# Patient Record
Sex: Female | Born: 1960 | Race: Black or African American | Hispanic: No | Marital: Single | Smoking: Former smoker
Health system: Southern US, Community
[De-identification: ages and names within clinical notes are randomized; demographics above are authoritative.]

## PROBLEM LIST (undated history)

## (undated) DIAGNOSIS — J302 Other seasonal allergic rhinitis: Secondary | ICD-10-CM

## (undated) DIAGNOSIS — F32A Depression, unspecified: Secondary | ICD-10-CM

## (undated) DIAGNOSIS — E785 Hyperlipidemia, unspecified: Secondary | ICD-10-CM

## (undated) DIAGNOSIS — Z9289 Personal history of other medical treatment: Secondary | ICD-10-CM

## (undated) DIAGNOSIS — F419 Anxiety disorder, unspecified: Secondary | ICD-10-CM

## (undated) DIAGNOSIS — D649 Anemia, unspecified: Secondary | ICD-10-CM

## (undated) DIAGNOSIS — R011 Cardiac murmur, unspecified: Secondary | ICD-10-CM

## (undated) DIAGNOSIS — F329 Major depressive disorder, single episode, unspecified: Secondary | ICD-10-CM

## (undated) DIAGNOSIS — M1990 Unspecified osteoarthritis, unspecified site: Secondary | ICD-10-CM

## (undated) DIAGNOSIS — I10 Essential (primary) hypertension: Secondary | ICD-10-CM

## (undated) HISTORY — DX: Depression, unspecified: F32.A

## (undated) HISTORY — DX: Unspecified osteoarthritis, unspecified site: M19.90

## (undated) HISTORY — PX: POLYPECTOMY: SHX149

## (undated) HISTORY — DX: Hyperlipidemia, unspecified: E78.5

## (undated) HISTORY — DX: Anxiety disorder, unspecified: F41.9

## (undated) HISTORY — PX: TUBAL LIGATION: SHX77

## (undated) HISTORY — DX: Major depressive disorder, single episode, unspecified: F32.9

## (undated) HISTORY — DX: Other seasonal allergic rhinitis: J30.2

## (undated) HISTORY — DX: Anemia, unspecified: D64.9

## (undated) HISTORY — PX: COLONOSCOPY: SHX174

## (undated) HISTORY — DX: Essential (primary) hypertension: I10

## (undated) HISTORY — DX: Personal history of other medical treatment: Z92.89

## (undated) HISTORY — PX: ESOPHAGOGASTRODUODENOSCOPY ENDOSCOPY: SHX5814

---

## 1998-01-09 ENCOUNTER — Emergency Department (HOSPITAL_COMMUNITY): Admission: EM | Admit: 1998-01-09 | Discharge: 1998-01-09 | Payer: Self-pay | Admitting: Emergency Medicine

## 1998-01-17 ENCOUNTER — Emergency Department (HOSPITAL_COMMUNITY): Admission: EM | Admit: 1998-01-17 | Discharge: 1998-01-17 | Payer: Self-pay | Admitting: Emergency Medicine

## 1998-07-13 ENCOUNTER — Emergency Department (HOSPITAL_COMMUNITY): Admission: EM | Admit: 1998-07-13 | Discharge: 1998-07-13 | Payer: Self-pay | Admitting: Emergency Medicine

## 2000-12-06 ENCOUNTER — Encounter: Payer: Self-pay | Admitting: Emergency Medicine

## 2000-12-06 ENCOUNTER — Emergency Department (HOSPITAL_COMMUNITY): Admission: EM | Admit: 2000-12-06 | Discharge: 2000-12-06 | Payer: Self-pay

## 2000-12-09 ENCOUNTER — Emergency Department (HOSPITAL_COMMUNITY): Admission: EM | Admit: 2000-12-09 | Discharge: 2000-12-09 | Payer: Self-pay | Admitting: Emergency Medicine

## 2001-05-11 ENCOUNTER — Emergency Department (HOSPITAL_COMMUNITY): Admission: EM | Admit: 2001-05-11 | Discharge: 2001-05-11 | Payer: Self-pay | Admitting: Emergency Medicine

## 2001-05-11 ENCOUNTER — Encounter: Payer: Self-pay | Admitting: Emergency Medicine

## 2003-04-19 ENCOUNTER — Encounter: Admission: RE | Admit: 2003-04-19 | Discharge: 2003-04-19 | Payer: Self-pay | Admitting: Internal Medicine

## 2003-07-29 ENCOUNTER — Emergency Department (HOSPITAL_COMMUNITY): Admission: EM | Admit: 2003-07-29 | Discharge: 2003-07-29 | Payer: Self-pay | Admitting: Family Medicine

## 2003-07-30 ENCOUNTER — Encounter: Admission: RE | Admit: 2003-07-30 | Discharge: 2003-07-30 | Payer: Self-pay | Admitting: Gastroenterology

## 2003-08-26 ENCOUNTER — Emergency Department (HOSPITAL_COMMUNITY): Admission: AD | Admit: 2003-08-26 | Discharge: 2003-08-26 | Payer: Self-pay | Admitting: Family Medicine

## 2005-12-21 ENCOUNTER — Ambulatory Visit (HOSPITAL_COMMUNITY): Admission: RE | Admit: 2005-12-21 | Discharge: 2005-12-21 | Payer: Self-pay | Admitting: Obstetrics

## 2006-02-11 ENCOUNTER — Encounter: Admission: RE | Admit: 2006-02-11 | Discharge: 2006-02-11 | Payer: Self-pay | Admitting: Obstetrics

## 2007-07-25 ENCOUNTER — Emergency Department (HOSPITAL_COMMUNITY): Admission: EM | Admit: 2007-07-25 | Discharge: 2007-07-26 | Payer: Self-pay | Admitting: Emergency Medicine

## 2010-06-27 ENCOUNTER — Encounter: Payer: Self-pay | Admitting: Gastroenterology

## 2010-06-28 ENCOUNTER — Encounter: Payer: Self-pay | Admitting: Obstetrics

## 2011-02-26 LAB — CROSSMATCH: ABO/RH(D): O POS

## 2011-02-26 LAB — BASIC METABOLIC PANEL
Chloride: 102
Glucose, Bld: 97

## 2011-02-26 LAB — CBC
MCHC: 28.6 — ABNORMAL LOW
MCV: 54.3 — ABNORMAL LOW
Platelets: 260
RBC: 3.47 — ABNORMAL LOW

## 2011-02-26 LAB — IRON AND TIBC: Iron: 13 — ABNORMAL LOW

## 2011-02-26 LAB — RETICULOCYTES
Retic Count, Absolute: 69.8
Retic Ct Pct: 2

## 2011-02-26 LAB — FERRITIN: Ferritin: <1 — ABNORMAL LOW (ref 10–291)

## 2011-02-26 LAB — DIFFERENTIAL
Basophils Relative: 6 — ABNORMAL HIGH
Eosinophils Relative: 1
Monocytes Relative: 9

## 2011-02-26 LAB — APTT: aPTT: 29

## 2011-02-26 LAB — PROTIME-INR
INR: 1
Prothrombin Time: 13.1

## 2011-02-26 LAB — ABO/RH: ABO/RH(D): O POS

## 2012-07-25 ENCOUNTER — Ambulatory Visit: Payer: Self-pay | Admitting: Family Medicine

## 2012-11-08 ENCOUNTER — Encounter: Payer: Self-pay | Admitting: Internal Medicine

## 2012-12-11 ENCOUNTER — Ambulatory Visit (AMBULATORY_SURGERY_CENTER): Payer: BC Managed Care – PPO | Admitting: *Deleted

## 2012-12-11 VITALS — Ht 69.0 in | Wt 224.6 lb

## 2012-12-11 DIAGNOSIS — Z1211 Encounter for screening for malignant neoplasm of colon: Secondary | ICD-10-CM

## 2012-12-11 MED ORDER — MOVIPREP 100 G PO SOLR: 1.0000 | Freq: Once | ORAL | Status: DC

## 2012-12-11 NOTE — Progress Notes (Signed)
Denies allergies to eggs or soy products. Denies complications with anesthesia or sedation. 

## 2012-12-12 ENCOUNTER — Encounter: Payer: Self-pay | Admitting: Internal Medicine

## 2012-12-22 ENCOUNTER — Ambulatory Visit (AMBULATORY_SURGERY_CENTER): Payer: BC Managed Care – PPO | Admitting: Internal Medicine

## 2012-12-22 ENCOUNTER — Encounter: Payer: Self-pay | Admitting: Internal Medicine

## 2012-12-22 VITALS — BP 159/78 | HR 67 | Temp 98.3°F | Resp 17 | Ht 69.0 in | Wt 224.0 lb

## 2012-12-22 DIAGNOSIS — D126 Benign neoplasm of colon, unspecified: Secondary | ICD-10-CM

## 2012-12-22 DIAGNOSIS — Z800 Family history of malignant neoplasm of digestive organs: Secondary | ICD-10-CM

## 2012-12-22 DIAGNOSIS — Z1211 Encounter for screening for malignant neoplasm of colon: Secondary | ICD-10-CM

## 2012-12-22 MED ORDER — SODIUM CHLORIDE 0.9 % IV SOLN: 500.0000 mL | INTRAVENOUS | Status: DC

## 2012-12-22 NOTE — Progress Notes (Signed)
Called to room to assist during endoscopic procedure.  Patient ID and intended procedure confirmed with present staff. Received instructions for my participation in the procedure from the performing physician.  

## 2012-12-22 NOTE — Progress Notes (Signed)
Patient did not experience any of the following events: a burn prior to discharge; a fall within the facility; wrong site/side/patient/procedure/implant event; or a hospital transfer or hospital admission upon discharge from the facility. (G8907) Patient did not have preoperative order for IV antibiotic SSI prophylaxis. (G8918)  

## 2012-12-22 NOTE — Op Note (Signed)
[facility] [address] [place], [address]   COLONOSCOPY PROCEDURE REPORT  PATIENT: Jessica Huber, Jessica Huber  MR#: [id number] BIRTHDATE: Dec 05, 1960 , 51  yrs. old GENDER: Female ENDOSCOPIST: Beverley Fiedler, MD REFERRED XB:JYNWG Cloward, M.D. PROCEDURE DATE:  12/22/2012 PROCEDURE:   Colonoscopy with snare polypectomy ASA CLASS:   Class II INDICATIONS:first colonoscopy, elevated risk screening, and Patient's immediate family history of colon cancer. MEDICATIONS: MAC sedation, administered by CRNA and propofol (Diprivan) 250mg  IV  DESCRIPTION OF PROCEDURE:   After the risks benefits and alternatives of the procedure were thoroughly explained, informed consent was obtained.  A digital rectal exam revealed no rectal mass.   The LB [id number]  endoscope was introduced through the anus and advanced to the cecum, which was identified by both the appendix and ileocecal valve. No adverse events experienced. The quality of the prep was good, using MoviPrep  The instrument was then slowly withdrawn as the colon was fully examined.   COLON FINDINGS: A sessile polyp measuring 6 mm in size was found in the rectosigmoid colon.  A polypectomy was performed with a cold snare.  The resection was complete and the polyp tissue was completely retrieved.   The colon mucosa was otherwise normal. Retroflexed views revealed no abnormalities other than hypertrophied anal papillae. The time to cecum=3 minutes 39 seconds.  Withdrawal time=10 minutes 11 seconds.  The scope was withdrawn and the procedure completed.  COMPLICATIONS: There were no complications.  ENDOSCOPIC IMPRESSION: 1.   Sessile polyp measuring 6 mm in size was found in the rectosigmoid colon; polypectomy was performed with a cold snare 2.   The colon mucosa was otherwise normal  RECOMMENDATIONS: 1.  Await pathology results 2.  Repeat Colonoscopy in 5 years. 3.  You will receive a letter within 1-2 weeks with the  results of your biopsy as well as final recommendations.  Please call my office if you have not received a letter after 3 weeks.   eSigned:  Beverley Fiedler, MD 12/22/2012 3:16 PM   cc: Gabriel Earing, MD and The Patient

## 2012-12-22 NOTE — Patient Instructions (Addendum)
Discharge instructions given with verbal understanding. Handout on polyps given. Resume previous medications. YOU HAD AN ENDOSCOPIC PROCEDURE TODAY AT THE [facility]: Refer to the procedure report that was given to you for any specific questions about what was found during the examination.  If the procedure report does not answer your questions, please call your gastroenterologist to clarify.  If you requested that your care partner not be given the details of your procedure findings, then the procedure report has been included in a sealed envelope for you to review at your convenience later.  YOU SHOULD EXPECT: Some feelings of bloating in the abdomen. Passage of more gas than usual.  Walking can help get rid of the air that was put into your GI tract during the procedure and reduce the bloating. If you had a lower endoscopy (such as a colonoscopy or flexible sigmoidoscopy) you may notice spotting of blood in your stool or on the toilet paper. If you underwent a bowel prep for your procedure, then you may not have a normal bowel movement for a few days.  DIET: Your first meal following the procedure should be a light meal and then it is ok to progress to your normal diet.  A half-sandwich or bowl of soup is an example of a good first meal.  Heavy or fried foods are harder to digest and may make you feel nauseous or bloated.  Likewise meals heavy in dairy and vegetables can cause extra gas to form and this can also increase the bloating.  Drink plenty of fluids but you should avoid alcoholic beverages for 24 hours.  ACTIVITY: Your care partner should take you home directly after the procedure.  You should plan to take it easy, moving slowly for the rest of the day.  You can resume normal activity the day after the procedure however you should NOT DRIVE or use heavy machinery for 24 hours (because of the sedation medicines used during the test).    SYMPTOMS TO REPORT IMMEDIATELY: A  gastroenterologist can be reached at any hour.  During normal business hours, 8:30 AM to 5:00 PM Monday through Friday, call [phone].  After hours and on weekends, please call the GI answering service at [phone] who will take a message and have the physician on call contact you.   Following lower endoscopy (colonoscopy or flexible sigmoidoscopy):  Excessive amounts of blood in the stool  Significant tenderness or worsening of abdominal pains  Swelling of the abdomen that is new, acute  Fever of 100F or higher  FOLLOW UP: If any biopsies were taken you will be contacted by phone or by letter within the next 1-3 weeks.  Call your gastroenterologist if you have not heard about the biopsies in 3 weeks.  Our staff will call the home number listed on your records the next business day following your procedure to check on you and address any questions or concerns that you may have at that time regarding the information given to you following your procedure. This is a courtesy call and so if there is no answer at the home number and we have not heard from you through the emergency physician on call, we will assume that you have returned to your regular daily activities without incident.  SIGNATURES/CONFIDENTIALITY: You and/or your care partner have signed paperwork which will be entered into your electronic medical record.  These signatures attest to the fact that that the information above on your After Visit Summary has   been reviewed and is understood.  Full responsibility of the confidentiality of this discharge information lies with you and/or your care-partner. 

## 2012-12-25 ENCOUNTER — Telehealth: Payer: Self-pay

## 2012-12-25 NOTE — Telephone Encounter (Signed)
Left message on answering machine. 

## 2012-12-27 ENCOUNTER — Encounter: Payer: Self-pay | Admitting: Internal Medicine

## 2012-12-27 DIAGNOSIS — D126 Benign neoplasm of colon, unspecified: Secondary | ICD-10-CM | POA: Insufficient documentation

## 2013-02-06 ENCOUNTER — Encounter: Payer: Self-pay | Admitting: Obstetrics & Gynecology

## 2013-02-07 ENCOUNTER — Encounter: Payer: Self-pay | Admitting: Obstetrics

## 2013-02-22 DIAGNOSIS — D509 Iron deficiency anemia, unspecified: Secondary | ICD-10-CM | POA: Insufficient documentation

## 2013-02-22 DIAGNOSIS — G723 Periodic paralysis: Secondary | ICD-10-CM | POA: Insufficient documentation

## 2013-02-22 DIAGNOSIS — R5381 Other malaise: Secondary | ICD-10-CM | POA: Insufficient documentation

## 2013-02-22 DIAGNOSIS — R5383 Other fatigue: Secondary | ICD-10-CM | POA: Insufficient documentation

## 2013-02-23 ENCOUNTER — Encounter: Payer: Self-pay | Admitting: Obstetrics

## 2013-02-26 ENCOUNTER — Ambulatory Visit: Payer: Self-pay | Admitting: Obstetrics & Gynecology

## 2013-02-28 ENCOUNTER — Ambulatory Visit: Payer: Self-pay | Admitting: Obstetrics & Gynecology

## 2013-03-19 ENCOUNTER — Other Ambulatory Visit: Payer: Self-pay

## 2013-03-19 ENCOUNTER — Encounter: Payer: Self-pay | Admitting: Obstetrics & Gynecology

## 2013-03-19 ENCOUNTER — Ambulatory Visit (INDEPENDENT_AMBULATORY_CARE_PROVIDER_SITE_OTHER): Payer: BC Managed Care – PPO | Admitting: Obstetrics & Gynecology

## 2013-03-19 ENCOUNTER — Ambulatory Visit: Payer: Self-pay | Admitting: Obstetrics & Gynecology

## 2013-03-19 VITALS — BP 135/88 | HR 77 | Temp 97.3°F | Ht 69.0 in | Wt 221.0 lb

## 2013-03-19 DIAGNOSIS — N760 Acute vaginitis: Secondary | ICD-10-CM

## 2013-03-19 DIAGNOSIS — N939 Abnormal uterine and vaginal bleeding, unspecified: Secondary | ICD-10-CM | POA: Insufficient documentation

## 2013-03-19 DIAGNOSIS — N926 Irregular menstruation, unspecified: Secondary | ICD-10-CM

## 2013-03-19 MED ORDER — NORETHINDRONE ACETATE 5 MG PO TABS: 5.0000 mg | ORAL_TABLET | Freq: Two times a day (BID) | ORAL | Status: DC

## 2013-03-19 NOTE — Progress Notes (Signed)
Endometrial Biopsy Procedure Note  Pre-operative Diagnosis: Abnormal uterine bleeding  Post-operative Diagnosis: same  Indications: abnormal uterine bleeding  Procedure Details     The risks (including infection, bleeding, pain, and uterine perforation) and benefits of the procedure were explained to the patient and Written informed consent was obtained.     The patient was placed in the dorsal lithotomy position.  Bimanual exam showed the uterus to be in the neutral position.  A Graves' speculum inserted in the vagina, and the cervix prepped with povidone iodine.    A sharp tenaculum was applied to the anterior lip of the cervix for stabilization.  A sterile uterine sound was used to sound the uterus to a depth of 7cm.  A Pipelle endometrial aspirator was used to sample the endometrium.  Sample was sent for pathologic examination.  Condition: Stable  Complications: None  Plan:  The patient was advised to call for any fever or for prolonged or severe pain or bleeding. She was advised to use OTC analgesics as needed for mild to moderate pain. She was advised to avoid vaginal intercourse for 48 hours or until the bleeding has completely stopped.

## 2013-03-19 NOTE — Patient Instructions (Signed)

## 2013-03-19 NOTE — Progress Notes (Signed)
Subjective:     Jessica Huber is a 52 y.o. female here for a routine exam.  Current complaints: problem visit. Pt states she has been having extremely heavy periods for the last 5 years. Pt states she has been going for IV iron treatments for the last 2 months. Pt states she has even needed a blood transfusion. Pt states her cycles last 2 weeks and is extremely heavy the entire time and patient states she passes clots.    Personal health questionnaire reviewed: yes.   Gynecologic History Patient's last menstrual period was 02/18/2013. Contraception: abstinence Last Pap: 02/2013. Results were: normal Last mammogram: approx. 15 years ago. Results were: normal  Obstetric History OB History  No data available     The following portions of the patient's history were reviewed and updated as appropriate: allergies, current medications, past family history, past medical history, past social history, past surgical history and problem list.  Review of Systems Pertinent items are noted in HPI.    Objective:      General appearance: alert Abdomen: soft, non-tender; bowel sounds normal; no masses,  no organomegaly Pelvic: cervix normal in appearance, external genitalia normal, no adnexal masses or tenderness, uterus normal size, shape, and consistency and vagina normal without discharge       Assessment:   AUB--?O  Plan:   Return for sonohysterogram Treatment options reviewed/education materials provided Aygestin 10 mg/day for now

## 2013-03-20 ENCOUNTER — Encounter: Payer: Self-pay | Admitting: Obstetrics & Gynecology

## 2013-03-20 LAB — WET PREP BY MOLECULAR PROBE: Gardnerella vaginalis: NEGATIVE

## 2013-03-23 ENCOUNTER — Telehealth: Payer: Self-pay | Admitting: *Deleted

## 2013-03-23 ENCOUNTER — Encounter: Payer: Self-pay | Admitting: Obstetrics & Gynecology

## 2013-03-23 DIAGNOSIS — A5901 Trichomonal vulvovaginitis: Secondary | ICD-10-CM | POA: Insufficient documentation

## 2013-03-23 MED ORDER — TINIDAZOLE 500 MG PO TABS: 2.0000 g | ORAL_TABLET | Freq: Once | ORAL | Status: DC

## 2013-03-23 NOTE — Telephone Encounter (Signed)
Patient informed per Dr Tamela Oddi instructions. Rx sent to pharmacy.

## 2013-03-23 NOTE — Telephone Encounter (Signed)
Message copied by Glendell Docker on Fri Mar 23, 2013  5:56 PM ------      Message from: Antionette Char      Created: Fri Mar 23, 2013  8:05 AM       Call pt w/result and treat. ------

## 2013-04-03 ENCOUNTER — Other Ambulatory Visit: Payer: Self-pay | Admitting: *Deleted

## 2013-04-03 DIAGNOSIS — D259 Leiomyoma of uterus, unspecified: Secondary | ICD-10-CM

## 2013-04-04 ENCOUNTER — Other Ambulatory Visit: Payer: BC Managed Care – PPO

## 2013-04-04 ENCOUNTER — Ambulatory Visit: Payer: BC Managed Care – PPO | Admitting: Obstetrics & Gynecology

## 2013-04-11 ENCOUNTER — Ambulatory Visit (INDEPENDENT_AMBULATORY_CARE_PROVIDER_SITE_OTHER): Payer: BC Managed Care – PPO

## 2013-04-11 ENCOUNTER — Encounter: Payer: Self-pay | Admitting: Obstetrics & Gynecology

## 2013-04-11 ENCOUNTER — Ambulatory Visit (INDEPENDENT_AMBULATORY_CARE_PROVIDER_SITE_OTHER): Payer: BC Managed Care – PPO | Admitting: Obstetrics & Gynecology

## 2013-04-11 VITALS — BP 162/95 | HR 71 | Temp 98.2°F | Ht 69.0 in | Wt 221.0 lb

## 2013-04-11 DIAGNOSIS — N939 Abnormal uterine and vaginal bleeding, unspecified: Secondary | ICD-10-CM

## 2013-04-11 DIAGNOSIS — D259 Leiomyoma of uterus, unspecified: Secondary | ICD-10-CM

## 2013-04-11 DIAGNOSIS — N926 Irregular menstruation, unspecified: Secondary | ICD-10-CM

## 2013-04-11 MED ORDER — LEUPROLIDE ACETATE 3.75 MG IM KIT
3.7500 mg | PACK | Freq: Once | INTRAMUSCULAR | Status: AC
  Administered 2013-04-11: 3.75 mg via INTRAMUSCULAR

## 2013-04-12 ENCOUNTER — Encounter: Payer: Self-pay | Admitting: Obstetrics & Gynecology

## 2013-04-12 DIAGNOSIS — F411 Generalized anxiety disorder: Secondary | ICD-10-CM | POA: Insufficient documentation

## 2013-04-12 NOTE — Progress Notes (Signed)
SONOHYSTEROGRAM PROCEDURE NOTE  SONOHYSTEROGRAM PROCEDURE:   A time-out was performed confirming patient name, allergy status and procedure.  A UPT was negative.  A Graves speculum was placed in the vagina.  The cervix was prepped with betadine.  The cervix was grasped with a single tooth tenaculum.  The IUI catherter was primed with sterile water.  The IUI was introduced into the uterine cavity.  The single tooth tenaculum and speculum were removed.  The vaginal probe was placed.  The sterile water was instilled in the uterine cavity.   The IUI was removed.  The single tooth tenaculum was removed with minimal bleeding noted from the cervix.  The patient tolerated the procedure well.  FINDINGS:  The endometrial cavity was irregular/distorted by submucous involvement of the fibroid tumors.

## 2013-04-12 NOTE — Patient Instructions (Signed)
Hysterectomy Information  A hysterectomy is a procedure where your uterus is surgically removed. It will no longer be possible to have menstrual periods or to become pregnant. The tubes and ovaries can be removed (bilateral salpingo-oopherectomy) during this surgery as well.  REASONS FOR A HYSTERECTOMY  Persistent, abnormal bleeding.  Lasting (chronic) pelvic pain or infection.  The lining of the uterus (endometrium) starts growing outside the uterus (endometriosis).  The endometrium starts growing in the muscle of the uterus (adenomyosis).  The uterus falls down into the vagina (pelvic organ prolapse).  Symptomatic uterine fibroids.  Precancerous cells.  Cervical cancer or uterine cancer. TYPES OF HYSTERECTOMIES  Supracervical hysterectomy. This type removes the top part of the uterus, but not the cervix.  Total hysterectomy. This type removes the uterus and cervix.  Radical hysterectomy. This type removes the uterus, cervix, and the fibrous tissue that holds the uterus in place in the pelvis (parametrium). WAYS A HYSTERECTOMY CAN BE PERFORMED  Abdominal hysterectomy. A large surgical cut (incision) is made in the abdomen. The uterus is removed through this incision.  Vaginal hysterectomy. An incision is made in the vagina. The uterus is removed through this incision. There are no abdominal incisions.  Conventional laparoscopic hysterectomy. A thin, lighted tube with a camera (laparoscope) is inserted into 3 or 4 small incisions in the abdomen. The uterus is cut into small pieces. The small pieces are removed through the incisions, or they are removed through the vagina.  Laparoscopic assisted vaginal hysterectomy (LAVH). Three or four small incisions are made in the abdomen. Part of the surgery is performed laparoscopically and part vaginally. The uterus is removed through the vagina.  Robot-assisted laparoscopic hysterectomy. A laparoscope is inserted into 3 or 4 small  incisions in the abdomen. A computer-controlled device is used to give the surgeon a 3D image. This allows for more precise movements of surgical instruments. The uterus is cut into small pieces and removed through the incisions or removed through the vagina. RISKS OF HYSTERECTOMY   Bleeding and risk of blood transfusion. Tell your caregiver if you do not want to receive any blood products.  Blood clots in the legs or lung.  Infection.  Injury to surrounding organs.  Anesthesia problems or side effects.  Conversion to an abdominal hysterectomy. WHAT TO EXPECT AFTER A HYSTERECTOMY  You will be given pain medicine.  You will need to have someone with you for the first 3 to 5 days after you go home.  You will need to follow up with your surgeon in 2 to 4 weeks after surgery to evaluate your progress.  You may have early menopause symptoms like hot flashes, night sweats, and insomnia.  If you had a hysterectomy for a problem that was not a cancer or a condition that could lead to cancer, then you no longer need Pap tests. However, even if you no longer need a Pap test, a regular exam is a good idea to make sure no other problems are starting. Document Released: 11/17/2000 Document Revised: 08/16/2011 Document Reviewed: 01/02/2011 Exit[employer] Patient Information 2014 [employer].  

## 2013-04-15 ENCOUNTER — Other Ambulatory Visit: Payer: Self-pay | Admitting: *Deleted

## 2013-04-16 ENCOUNTER — Encounter: Payer: Self-pay | Admitting: Obstetrics & Gynecology

## 2013-04-20 ENCOUNTER — Encounter: Payer: Self-pay | Admitting: Obstetrics & Gynecology

## 2013-04-26 ENCOUNTER — Ambulatory Visit: Payer: BC Managed Care – PPO | Admitting: Obstetrics & Gynecology

## 2013-05-01 ENCOUNTER — Other Ambulatory Visit: Payer: Self-pay | Admitting: *Deleted

## 2013-05-01 DIAGNOSIS — A599 Trichomoniasis, unspecified: Secondary | ICD-10-CM

## 2013-05-01 MED ORDER — TINIDAZOLE 500 MG PO TABS: 2.0000 g | ORAL_TABLET | Freq: Once | ORAL | Status: DC

## 2013-05-02 ENCOUNTER — Ambulatory Visit: Payer: BC Managed Care – PPO | Admitting: Obstetrics & Gynecology

## 2013-05-04 ENCOUNTER — Encounter (HOSPITAL_COMMUNITY): Payer: Self-pay | Admitting: Pharmacy Technician

## 2013-05-08 ENCOUNTER — Encounter (HOSPITAL_COMMUNITY)
Admission: RE | Admit: 2013-05-08 | Discharge: 2013-05-08 | Disposition: A | Discharge status: Home / Self Care | Payer: BC Managed Care – PPO | Source: Ambulatory Visit | Attending: Obstetrics & Gynecology | Admitting: Obstetrics & Gynecology

## 2013-05-08 ENCOUNTER — Encounter (HOSPITAL_COMMUNITY): Payer: Self-pay

## 2013-05-08 DIAGNOSIS — D259 Leiomyoma of uterus, unspecified: Secondary | ICD-10-CM | POA: Insufficient documentation

## 2013-05-08 DIAGNOSIS — Z01812 Encounter for preprocedural laboratory examination: Secondary | ICD-10-CM | POA: Insufficient documentation

## 2013-05-08 HISTORY — DX: Cardiac murmur, unspecified: R01.1

## 2013-05-08 LAB — HCG, SERUM, QUALITATIVE: Preg, Serum: NEGATIVE

## 2013-05-08 LAB — CBC
Hemoglobin: 10.7 g/dL — ABNORMAL LOW (ref 12.0–15.0)
MCH: 26.9 pg (ref 26.0–34.0)
MCHC: 33 g/dL (ref 30.0–36.0)
MCV: 81.4 fL (ref 78.0–100.0)
Platelets: 290 10*3/uL (ref 150–400)
RBC: 3.98 MIL/uL (ref 3.87–5.11)
RDW: 15.6 % — ABNORMAL HIGH (ref 11.5–15.5)
WBC: 3.7 10*3/uL — ABNORMAL LOW (ref 4.0–10.5)

## 2013-05-08 NOTE — H&P (Signed)
  Subjective:  Jessica Huber is a 52 y.o. female.  She has a history of abnormal uterine bleeding and a diagnosis of uterine fibroids.  Onset of symptoms was gradual starting 5 years ago with unchanged course since that time. Bleeding is characterized as heavy.  Evaluation to date: endometrial biopsy: negative and pelvic ultrasound: positive for uterine fibroids including a sonohysterogram that showed submucous involvement. Treatment to date: depo lupron therapy, iron therapy for anemia and Aygestin.  Pertinent Gyn History:  Menses: see above Bleeding: see above Contraception: tubal ligation Blood transfusions: yes Preventive screening:  Last mammogram: normal  Last pap: normal   Patient Active Problem List   Diagnosis Date Noted  . Anxiety state 04/12/2013  . Trichomonal vulvovaginitis 03/23/2013  . Abnormal uterine bleeding 03/19/2013  . Iron deficiency anemia 02/22/2013  . Malaise and fatigue 02/22/2013  . Adenomatous colon polyp 12/27/2012   Past Medical History  Diagnosis Date  . Seasonal allergies   . Anxiety   . Depression   . History of blood transfusion   . Anemia     16'10,96'04 -transfusions due to anemia  . Heart murmur     many years ago    Past Surgical History  Procedure Laterality Date  . Esophagogastroduodenoscopy endoscopy    . Cesarean section      x2  . Tubal ligation      No prescriptions prior to admission   No Known Allergies  History  Substance Use Topics  . Smoking status: Former Smoker    Quit date: 06/07/1992  . Smokeless tobacco: Never Used  . Alcohol Use: No    Family History  Problem Relation Age of Onset  . Colon cancer Paternal Grandfather   . Esophageal cancer Neg Hx   . Rectal cancer Neg Hx   . Stomach cancer Neg Hx      Review of Systems Pertinent items are noted in HPI.    Objective:   Vital signs in last 24 hours: Temp:  [98 F (36.7 C)] 98 F (36.7 C) (12/02 0915) Pulse Rate:  [78] 78 (12/02 0915) Resp:  [18] 18  (12/02 0915) BP: (145)/(90) 145/90 mmHg (12/02 0915) SpO2:  [100 %] 100 % (12/02 0915) Weight:  [98.204 kg (216 lb 8 oz)] 98.204 kg (216 lb 8 oz) (12/02 0915)     General appearance: alert Breasts: normal appearance, no masses or tenderness Abdomen: soft, non-tender; bowel sounds normal; no masses,  no organomegaly Pelvic: cervix normal in appearance, external genitalia normal, no adnexal masses or tenderness, uterus 12 weeks size and vagina normal without discharge    Assessment/Plan:  AUB-L--submucous  An attempted robotic-assisted hysterectomy with bilateral salpingectomies is planned.  She has received Depo Lupron preoperatively.  I had a lengthy discussion with the patient regarding her bleeding and consideration for  hysterectomy.  Procedure, risks, reasons, benefits and complications (including injury to bowel, bladder, major blood vessel, ureter, bleeding, possibility of transfusion, infection, thromboembolic complication or fistula formation) were reviewed in detail. Consent was signed and preop testing was ordered.  Instructions were reviewed, including NPO after midnight.

## 2013-05-08 NOTE — Patient Instructions (Addendum)
20 Adaliah R Witters  05/08/2013   Your procedure is scheduled on: 12-5  -2014  Report to [facility] at     0830   AM   Call this number if you have problems the morning of surgery: [phone]  Or Presurgical Testing [phone](Symone Cornman)   Do not eat food:After Midnight.    Take these medicines the morning of surgery with A SIP OF WATER: none   Do not wear jewelry, make-up or nail polish.  Do not wear lotions, powders, or perfumes. You may wear deodorant.  Do not shave 12 hours prior to first CHG shower(legs and under arms).(face and neck okay.)  Do not bring valuables to the hospital.  Contacts, dentures or removable bridgework, body piercing, hair pins may not be worn into surgery.  Leave suitcase in the car. After surgery it may be brought to your room.  For patients admitted to the hospital, checkout time is 11:00 AM the day of discharge.   Patients discharged the day of surgery will not be allowed to drive home. Must have responsible person with you x 24 hours once discharged.  Name and phone number of your driver: Akemi Overholser, daughter [phone] cell  Special Instructions: CHG(Chlorhedine 4%-"Hibiclens","Betasept","Aplicare") Shower Use Special Wash: see special instructions.(avoid face and genitals)   Please read over the following fact sheets that you were given: Blood Transfusion fact sheet.   Remember : Type/Screen "Blue armbands" - may not be removed once applied(would result in being retested if removed).  Failure to follow these instructions may result in Cancellation of your surgery.   Patient signature_______________________________________________________

## 2013-05-11 ENCOUNTER — Encounter (HOSPITAL_COMMUNITY)
Admission: RE | Disposition: A | Discharge status: Home / Self Care | Payer: BC Managed Care – PPO | Source: Ambulatory Visit | Attending: Obstetrics & Gynecology

## 2013-05-11 ENCOUNTER — Inpatient Hospital Stay (HOSPITAL_COMMUNITY)
Admission: RE | Admit: 2013-05-11 | Discharge: 2013-05-13 | DRG: 743 | Disposition: A | Discharge status: Home / Self Care | Payer: BC Managed Care – PPO | Source: Ambulatory Visit | Attending: Obstetrics & Gynecology | Admitting: Obstetrics & Gynecology

## 2013-05-11 ENCOUNTER — Encounter (HOSPITAL_COMMUNITY): Payer: BC Managed Care – PPO | Admitting: Certified Registered"

## 2013-05-11 ENCOUNTER — Encounter (HOSPITAL_COMMUNITY): Payer: Self-pay | Admitting: *Deleted

## 2013-05-11 ENCOUNTER — Ambulatory Visit (HOSPITAL_COMMUNITY): Payer: BC Managed Care – PPO | Admitting: Certified Registered"

## 2013-05-11 DIAGNOSIS — R011 Cardiac murmur, unspecified: Secondary | ICD-10-CM

## 2013-05-11 DIAGNOSIS — Z23 Encounter for immunization: Secondary | ICD-10-CM

## 2013-05-11 DIAGNOSIS — I440 Atrioventricular block, first degree: Secondary | ICD-10-CM | POA: Diagnosis present

## 2013-05-11 DIAGNOSIS — F3289 Other specified depressive episodes: Secondary | ICD-10-CM | POA: Diagnosis present

## 2013-05-11 DIAGNOSIS — F411 Generalized anxiety disorder: Secondary | ICD-10-CM | POA: Diagnosis present

## 2013-05-11 DIAGNOSIS — D500 Iron deficiency anemia secondary to blood loss (chronic): Secondary | ICD-10-CM | POA: Diagnosis present

## 2013-05-11 DIAGNOSIS — F329 Major depressive disorder, single episode, unspecified: Secondary | ICD-10-CM | POA: Diagnosis present

## 2013-05-11 DIAGNOSIS — N920 Excessive and frequent menstruation with regular cycle: Secondary | ICD-10-CM

## 2013-05-11 DIAGNOSIS — Z87891 Personal history of nicotine dependence: Secondary | ICD-10-CM

## 2013-05-11 DIAGNOSIS — D259 Leiomyoma of uterus, unspecified: Secondary | ICD-10-CM

## 2013-05-11 DIAGNOSIS — N939 Abnormal uterine and vaginal bleeding, unspecified: Secondary | ICD-10-CM | POA: Diagnosis present

## 2013-05-11 DIAGNOSIS — I498 Other specified cardiac arrhythmias: Secondary | ICD-10-CM | POA: Diagnosis not present

## 2013-05-11 DIAGNOSIS — N926 Irregular menstruation, unspecified: Secondary | ICD-10-CM | POA: Diagnosis present

## 2013-05-11 DIAGNOSIS — Z800 Family history of malignant neoplasm of digestive organs: Secondary | ICD-10-CM

## 2013-05-11 HISTORY — PX: ROBOTIC ASSISTED TOTAL HYSTERECTOMY WITH BILATERAL SALPINGO OOPHERECTOMY: SHX6086

## 2013-05-11 LAB — TYPE AND SCREEN

## 2013-05-11 SURGERY — ROBOTIC ASSISTED TOTAL HYSTERECTOMY WITH BILATERAL SALPINGO OOPHORECTOMY
Anesthesia: General | Site: Abdomen | Laterality: Bilateral

## 2013-05-11 MED ORDER — EPHEDRINE SULFATE 50 MG/ML IJ SOLN
INTRAMUSCULAR | Status: AC
  Filled 2013-05-11: qty 1

## 2013-05-11 MED ORDER — OXYCODONE-ACETAMINOPHEN 5-325 MG PO TABS
1.0000 | ORAL_TABLET | ORAL | Status: DC | PRN
  Administered 2013-05-11 – 2013-05-13 (×5): 2 via ORAL
  Filled 2013-05-11 (×5): qty 2

## 2013-05-11 MED ORDER — BUPIVACAINE HCL (PF) 0.25 % IJ SOLN
INTRAMUSCULAR | Status: DC | PRN
  Administered 2013-05-11: 10 mL

## 2013-05-11 MED ORDER — ONDANSETRON HCL 4 MG/2ML IJ SOLN
INTRAMUSCULAR | Status: DC | PRN
  Administered 2013-05-11: 4 mg via INTRAVENOUS

## 2013-05-11 MED ORDER — ROCURONIUM BROMIDE 100 MG/10ML IV SOLN
INTRAVENOUS | Status: DC | PRN
  Administered 2013-05-11: 10 mg via INTRAVENOUS
  Administered 2013-05-11: 50 mg via INTRAVENOUS
  Administered 2013-05-11: 10 mg via INTRAVENOUS

## 2013-05-11 MED ORDER — GLYCOPYRROLATE 0.2 MG/ML IJ SOLN
INTRAMUSCULAR | Status: DC | PRN
  Administered 2013-05-11: 0.6 mg via INTRAVENOUS

## 2013-05-11 MED ORDER — MENTHOL 3 MG MT LOZG: 1.0000 | LOZENGE | OROMUCOSAL | Status: DC | PRN

## 2013-05-11 MED ORDER — CEFAZOLIN SODIUM-DEXTROSE 2-3 GM-% IV SOLR
2.0000 g | INTRAVENOUS | Status: AC
  Administered 2013-05-11: 2 g via INTRAVENOUS

## 2013-05-11 MED ORDER — DEXTROSE-NACL 5-0.45 % IV SOLN
INTRAVENOUS | Status: DC
  Administered 2013-05-11 – 2013-05-12 (×2): via INTRAVENOUS

## 2013-05-11 MED ORDER — HYDROMORPHONE HCL PF 1 MG/ML IJ SOLN
INTRAMUSCULAR | Status: DC | PRN
  Administered 2013-05-11 (×2): 1 mg via INTRAVENOUS

## 2013-05-11 MED ORDER — METRONIDAZOLE IN NACL 5-0.79 MG/ML-% IV SOLN
INTRAVENOUS | Status: AC
  Filled 2013-05-11: qty 100

## 2013-05-11 MED ORDER — HYDROMORPHONE HCL PF 2 MG/ML IJ SOLN
INTRAMUSCULAR | Status: AC
  Filled 2013-05-11: qty 1

## 2013-05-11 MED ORDER — MIDAZOLAM HCL 2 MG/2ML IJ SOLN
INTRAMUSCULAR | Status: AC
  Filled 2013-05-11: qty 2

## 2013-05-11 MED ORDER — EPHEDRINE SULFATE 50 MG/ML IJ SOLN
INTRAMUSCULAR | Status: DC | PRN
  Administered 2013-05-11: 5 mg via INTRAVENOUS
  Administered 2013-05-11: 10 mg via INTRAVENOUS

## 2013-05-11 MED ORDER — MIDAZOLAM HCL 5 MG/5ML IJ SOLN
INTRAMUSCULAR | Status: DC | PRN
  Administered 2013-05-11: 2 mg via INTRAVENOUS

## 2013-05-11 MED ORDER — KETOROLAC TROMETHAMINE 30 MG/ML IJ SOLN
30.0000 mg | Freq: Four times a day (QID) | INTRAMUSCULAR | Status: DC
Start: 2013-05-11 — End: 2013-05-13
  Administered 2013-05-11 – 2013-05-13 (×7): 30 mg via INTRAVENOUS
  Filled 2013-05-11 (×11): qty 1

## 2013-05-11 MED ORDER — HYDROMORPHONE HCL PF 1 MG/ML IJ SOLN
0.2000 mg | INTRAMUSCULAR | Status: DC | PRN
  Administered 2013-05-12 (×2): 0.6 mg via INTRAVENOUS
  Filled 2013-05-11 (×2): qty 1

## 2013-05-11 MED ORDER — SUCCINYLCHOLINE CHLORIDE 20 MG/ML IJ SOLN
INTRAMUSCULAR | Status: DC | PRN
  Administered 2013-05-11: 100 mg via INTRAVENOUS

## 2013-05-11 MED ORDER — INFLUENZA VAC SPLIT QUAD 0.5 ML IM SUSP
0.5000 mL | INTRAMUSCULAR | Status: AC
  Administered 2013-05-12: 10:00:00 0.5 mL via INTRAMUSCULAR
  Filled 2013-05-11 (×2): qty 0.5

## 2013-05-11 MED ORDER — LACTATED RINGERS IV SOLN
INTRAVENOUS | Status: DC
  Administered 2013-05-11: 12:00:00 via INTRAVENOUS
  Administered 2013-05-11: 1000 mL via INTRAVENOUS

## 2013-05-11 MED ORDER — DEXAMETHASONE SODIUM PHOSPHATE 10 MG/ML IJ SOLN
INTRAMUSCULAR | Status: AC
  Filled 2013-05-11: qty 1

## 2013-05-11 MED ORDER — KETOROLAC TROMETHAMINE 30 MG/ML IJ SOLN
30.0000 mg | Freq: Four times a day (QID) | INTRAMUSCULAR | Status: DC
  Filled 2013-05-11 (×8): qty 1

## 2013-05-11 MED ORDER — METRONIDAZOLE IN NACL 5-0.79 MG/ML-% IV SOLN
500.0000 mg | Freq: Once | INTRAVENOUS | Status: AC
  Administered 2013-05-11: 500 g via INTRAVENOUS

## 2013-05-11 MED ORDER — PANTOPRAZOLE SODIUM 40 MG PO TBEC
40.0000 mg | DELAYED_RELEASE_TABLET | Freq: Every day | ORAL | Status: DC
  Administered 2013-05-11 – 2013-05-12 (×2): 40 mg via ORAL
  Filled 2013-05-11 (×3): qty 1

## 2013-05-11 MED ORDER — DEXAMETHASONE SODIUM PHOSPHATE 10 MG/ML IJ SOLN
INTRAMUSCULAR | Status: DC | PRN
  Administered 2013-05-11: 10 mg via INTRAVENOUS

## 2013-05-11 MED ORDER — PROPOFOL 10 MG/ML IV BOLUS
INTRAVENOUS | Status: AC
  Filled 2013-05-11: qty 20

## 2013-05-11 MED ORDER — ONDANSETRON HCL 4 MG/2ML IJ SOLN
INTRAMUSCULAR | Status: AC
  Filled 2013-05-11: qty 2

## 2013-05-11 MED ORDER — ONDANSETRON HCL 4 MG PO TABS: 4.0000 mg | ORAL_TABLET | Freq: Four times a day (QID) | ORAL | Status: DC | PRN

## 2013-05-11 MED ORDER — PROPOFOL 10 MG/ML IV BOLUS
INTRAVENOUS | Status: DC | PRN
  Administered 2013-05-11: 200 mg via INTRAVENOUS

## 2013-05-11 MED ORDER — ROCURONIUM BROMIDE 100 MG/10ML IV SOLN
INTRAVENOUS | Status: AC
  Filled 2013-05-11: qty 1

## 2013-05-11 MED ORDER — FENTANYL CITRATE 0.05 MG/ML IJ SOLN
INTRAMUSCULAR | Status: AC
  Filled 2013-05-11: qty 5

## 2013-05-11 MED ORDER — ZOLPIDEM TARTRATE 5 MG PO TABS
5.0000 mg | ORAL_TABLET | Freq: Every evening | ORAL | Status: DC | PRN
  Administered 2013-05-11 – 2013-05-12 (×2): 5 mg via ORAL
  Filled 2013-05-11 (×2): qty 1

## 2013-05-11 MED ORDER — CEFAZOLIN SODIUM-DEXTROSE 2-3 GM-% IV SOLR
INTRAVENOUS | Status: AC
  Filled 2013-05-11: qty 50

## 2013-05-11 MED ORDER — LACTATED RINGERS IR SOLN
Status: DC | PRN
  Administered 2013-05-11: 1000 mL

## 2013-05-11 MED ORDER — ONDANSETRON HCL 4 MG/2ML IJ SOLN
4.0000 mg | Freq: Four times a day (QID) | INTRAMUSCULAR | Status: DC | PRN
  Administered 2013-05-11 – 2013-05-12 (×2): 4 mg via INTRAVENOUS
  Filled 2013-05-11 (×2): qty 2

## 2013-05-11 MED ORDER — IBUPROFEN 800 MG PO TABS
800.0000 mg | ORAL_TABLET | Freq: Three times a day (TID) | ORAL | Status: DC | PRN
  Administered 2013-05-13: 800 mg via ORAL
  Filled 2013-05-11: qty 1

## 2013-05-11 MED ORDER — FENTANYL CITRATE 0.05 MG/ML IJ SOLN
INTRAMUSCULAR | Status: DC | PRN
  Administered 2013-05-11: 100 ug via INTRAVENOUS
  Administered 2013-05-11 (×3): 50 ug via INTRAVENOUS

## 2013-05-11 MED ORDER — BUPIVACAINE HCL (PF) 0.25 % IJ SOLN
INTRAMUSCULAR | Status: AC
  Filled 2013-05-11: qty 30

## 2013-05-11 MED ORDER — KETOROLAC TROMETHAMINE 30 MG/ML IJ SOLN: 30.0000 mg | Freq: Once | INTRAMUSCULAR | Status: DC

## 2013-05-11 MED ORDER — SIMETHICONE 80 MG PO CHEW
80.0000 mg | CHEWABLE_TABLET | Freq: Four times a day (QID) | ORAL | Status: DC | PRN
  Filled 2013-05-11 (×2): qty 1

## 2013-05-11 MED ORDER — NEOSTIGMINE METHYLSULFATE 1 MG/ML IJ SOLN
INTRAMUSCULAR | Status: DC | PRN
  Administered 2013-05-11: 4 mg via INTRAVENOUS

## 2013-05-11 MED ORDER — SUCCINYLCHOLINE CHLORIDE 20 MG/ML IJ SOLN
INTRAMUSCULAR | Status: AC
  Filled 2013-05-11: qty 1

## 2013-05-11 MED ORDER — STERILE WATER FOR IRRIGATION IR SOLN
Status: DC | PRN
  Administered 2013-05-11: 1500 mL

## 2013-05-11 MED ORDER — SODIUM CHLORIDE 0.9 % IJ SOLN
INTRAMUSCULAR | Status: AC
  Filled 2013-05-11: qty 10

## 2013-05-11 SURGICAL SUPPLY — 61 items
APL SKNCLS STERI-STRIP NONHPOA (GAUZE/BANDAGES/DRESSINGS) ×1
BAG SPEC RTRVL LRG 6X4 10 (ENDOMECHANICALS) ×2
BENZOIN TINCTURE PRP APPL 2/3 (GAUZE/BANDAGES/DRESSINGS) ×2 IMPLANT
CABLE HIGH FREQUENCY MONO STRZ (ELECTRODE) ×1 IMPLANT
CELLS DAT CNTRL 66122 CELL SVR (MISCELLANEOUS) ×1 IMPLANT
CHLORAPREP W/TINT 26ML (MISCELLANEOUS) ×2 IMPLANT
CORD HIGH FREQUENCY UNIPOLAR (ELECTROSURGICAL) ×2 IMPLANT
CORDS BIPOLAR (ELECTRODE) ×2 IMPLANT
COVER SURGICAL LIGHT HANDLE (MISCELLANEOUS) ×2 IMPLANT
COVER TIP SHEARS 8 DVNC (MISCELLANEOUS) ×1 IMPLANT
COVER TIP SHEARS 8MM DA VINCI (MISCELLANEOUS) ×1
DECANTER SPIKE VIAL GLASS SM (MISCELLANEOUS) ×2 IMPLANT
DRAPE LG THREE QUARTER DISP (DRAPES) ×2 IMPLANT
DRAPE SURG IRRIG POUCH 19X23 (DRAPES) ×2 IMPLANT
DRAPE TABLE BACK 44X90 PK DISP (DRAPES) ×2 IMPLANT
DRAPE UTILITY XL STRL (DRAPES) ×2 IMPLANT
DRAPE WARM FLUID 44X44 (DRAPE) ×1 IMPLANT
DRSG TEGADERM 2-3/8X2-3/4 SM (GAUZE/BANDAGES/DRESSINGS) ×2 IMPLANT
DRSG TEGADERM 4X4.75 (GAUZE/BANDAGES/DRESSINGS) ×2 IMPLANT
DRSG TEGADERM 6X8 (GAUZE/BANDAGES/DRESSINGS) ×4 IMPLANT
ELECT REM PT RETURN 9FT ADLT (ELECTROSURGICAL) ×2
ELECTRODE REM PT RTRN 9FT ADLT (ELECTROSURGICAL) ×1 IMPLANT
GAUZE SPONGE 2X2 8PLY STRL LF (GAUZE/BANDAGES/DRESSINGS) ×2 IMPLANT
GLOVE BIO SURGEON STRL SZ 6.5 (GLOVE) ×8 IMPLANT
GLOVE BIO SURGEON STRL SZ7.5 (GLOVE) ×4 IMPLANT
GLOVE BIOGEL PI IND STRL 7.0 (GLOVE) ×2 IMPLANT
GLOVE BIOGEL PI INDICATOR 7.0 (GLOVE) ×2
GOWN PREVENTION PLUS XLARGE (GOWN DISPOSABLE) ×10 IMPLANT
GOWN STRL NON-REIN LRG LVL3 (GOWN DISPOSABLE) ×8 IMPLANT
HOLDER FOLEY CATH W/STRAP (MISCELLANEOUS) ×2 IMPLANT
KIT ACCESSORY DA VINCI DISP (KITS) ×1
KIT ACCESSORY DVNC DISP (KITS) ×1 IMPLANT
MANIPULATOR UTERINE 4.5 ZUMI (MISCELLANEOUS) ×2 IMPLANT
OCCLUDER COLPOPNEUMO (BALLOONS) ×2 IMPLANT
PENCIL BUTTON HOLSTER BLD 10FT (ELECTRODE) ×2 IMPLANT
POUCH ENDO CATCH II 15MM (MISCELLANEOUS) ×1 IMPLANT
POUCH SPECIMEN RETRIEVAL 10MM (ENDOMECHANICALS) ×4 IMPLANT
RETRACTOR WND ALEXIS 18 MED (MISCELLANEOUS) IMPLANT
RTRCTR WOUND ALEXIS 18CM MED (MISCELLANEOUS) ×2
SET TUBE IRRIG SUCTION NO TIP (IRRIGATION / IRRIGATOR) ×2 IMPLANT
SHEET LAVH (DRAPES) ×1 IMPLANT
SOLUTION ELECTROLUBE (MISCELLANEOUS) ×2 IMPLANT
SPONGE GAUZE 2X2 STER 10/PKG (GAUZE/BANDAGES/DRESSINGS) ×2
SPONGE LAP 18X18 X RAY DECT (DISPOSABLE) ×1 IMPLANT
STRIP CLOSURE SKIN 1/2X4 (GAUZE/BANDAGES/DRESSINGS) ×2 IMPLANT
SUT VIC AB 0 CT1 27 (SUTURE) ×6
SUT VIC AB 0 CT1 27XBRD ANTBC (SUTURE) ×3 IMPLANT
SUT VIC AB 4-0 PS2 27 (SUTURE) ×4 IMPLANT
SUT VICRYL 0 UR6 27IN ABS (SUTURE) ×2 IMPLANT
SYR 50ML LL SCALE MARK (SYRINGE) ×2 IMPLANT
SYR BULB IRRIGATION 50ML (SYRINGE) IMPLANT
TOWEL NATURAL 10PK STERILE (DISPOSABLE) ×4 IMPLANT
TOWEL OR NON WOVEN STRL DISP B (DISPOSABLE) ×1 IMPLANT
TRAP SPECIMEN MUCOUS 40CC (MISCELLANEOUS) ×2 IMPLANT
TRAY FOLEY CATH 14FRSI W/METER (CATHETERS) ×2 IMPLANT
TRAY LAP CHOLE (CUSTOM PROCEDURE TRAY) ×2 IMPLANT
TROCAR 12M 150ML BLUNT (TROCAR) ×1 IMPLANT
TROCAR BLADELESS OPT 5 100 (ENDOMECHANICALS) ×2 IMPLANT
TROCAR XCEL 12X100 BLDLESS (ENDOMECHANICALS) ×2 IMPLANT
TUBING INSUFFLATION 10FT LAP (TUBING) ×2 IMPLANT
WATER STERILE IRR 1500ML POUR (IV SOLUTION) ×4 IMPLANT

## 2013-05-11 NOTE — Transfer of Care (Signed)
Immediate Anesthesia Transfer of Care Note  Patient: Jessica Huber  Procedure(s) Performed: Procedure(s) (LRB): ROBOTIC ASSISTED TOTAL HYSTERECTOMY WITH BILATERAL SALPINGECTOMY (Bilateral)  Patient Location: PACU  Anesthesia Type: General  Level of Consciousness: sedated, patient cooperative and responds to stimulation  Airway & Oxygen Therapy: Patient Spontanous Breathing and Patient connected to face mask oxgen  Post-op Assessment: Report given to PACU RN and Post -op Vital signs reviewed and stable  Post vital signs: Reviewed and stable  Complications: No apparent anesthesia complications

## 2013-05-11 NOTE — Op Note (Signed)
Pre-operative Diagnosis: abnormal uterine bleeding and fibroids  Post-operative Diagnosis: same  Operation: Robotic-assisted hysterectomy with bilateral salpingectomy  Surgeon: Roseanna Rainbow  Assistant: Coral Ceo, MD  Anesthesia: GET  Urine Output: per Anesthesiology  Findings: Uterus with diffuse fibroid involvement.  Left fallopian tube adhesions to the pelvic sidewall.  Omental adhesions to the anterior abdominal wall.  Normal ovaries.  Estimated Blood Loss:  150 ml                 Total IV Fluids: per Anesthesiology         Specimens: PATHOLOGY               Complications:  None; patient tolerated the procedure well.         Disposition: PACU - hemodynamically stable.         Condition: Stable    Procedure Details  The patient was seen in the Holding Room. The risks, benefits, complications, treatment options, and expected outcomes were discussed with the patient.  The patient concurred with the proposed plan, giving informed consent.  The site of surgery properly noted/marked. The patient was identified as Jessica Huber and the procedure verified as a Robotic-assisted hysterectomy with bilateral salpingectomy. A Time Out was held and the above information confirmed.  After induction of anesthesia, the patient was draped and prepped in the usual sterile manner. Pt was placed in supine position after anesthesia and draped and prepped in the usual sterile manner. The abdominal drape was placed after the CholoraPrep had been allowed to dry for 3 minutes.  Her arms were tucked to her side with all appropriate precautions.  The shoulder blocks were placed in the usual fashion.  The patient was placed in the semi-lithotomy position in Stratton stirrups.  The perineum was prepped with Betadine.  Foley catheter was placed.  A sterile speculum was placed in the vagina.  The cervix was grasped with a single-tooth tenaculum and dilated with Shawnie Pons dilators.  The ZUMI uterine  manipulator with a medium colpotomizer ring was placed without difficulty.  A pneum occluder balloon was placed over the manipulator.  A second time-out was performed.  OG tube placement was confirmed and to suction.  Approximately 2 cm below the costal margin, in the midclavicular line the skin was anesthestized with 0.25% Marcaine.  A 5 mm incision was made and using a 5 mm Optiview, a 5 mm trocar was placed under direct vision.  The patient's abdomen was insufflated with CO2 gas.  At this point and all points during the procedure, the patient's intra-abdominal pressure did not exceed 15 mmHg.  A 10-12 camera port was place 24 cm above the pubic symphysis.  Bilateral 8 mm ports were place 10 cm and 15 degrees inferior.  All ports were placed under direct visualization.  The above noted omental adhesions were divided with monopolar cautery.  The 5 mm port was removed.  The incision was extended to accommodate a 10 mm trocar.  A 10 mm trocar and sleeve were advanced under direct visualization.   The robot was docked in the usual fashion.  The round ligament on the right was transected with monopolar cautery.  The anterior and posterior leaves of the broad ligament were opened.  The ureter was identified.  A window was made between the infundibulopelvic ligament and the ureter.  The right utero-ovarian ligament and proximal fallopian tube were was coagulated with bipolar cautery and transected.  The uterine vessels were skeletonized to the level  of the [facility] ring. The uterine vessels were coagulated with bipolar cautery and transected.   A C-loop was created in the usual fashion.  A similar procedure was performed on the patient's left side. The round ligament on the left was transected with monopolar cautery.  The anterior and posterior leaves of the broad ligament were opened.  The ureter was identified.  A window was made between the infundibulopelvic ligament and the ureter.  The left utero-ovarian ligament and  proximal fallopian tube were was coagulated with bipolar cautery and transected.  The uterine vessels were skeletonized to the level of the Koh ring. The uterine vessels were coagulated with bipolar cautery and transected.  A C-loop was created in the usual fashion.   The bladder flap was completed. Several myomas were then excised using monopolar scissors.   At this time, the pneum occluder balloon was insufflated and a colpotomy was performed.  The uterus, cervix and myomas were delivered through the vagina. The right mesosalpinx was divided with monopolar cautery and the fallopian tube was removed from the abdomen.  The left fallopian tube was manipulated in a similar fashion.   All pedicles were inspected under low intraabdominal pressures and noted to be hemostatic.  The vagina cuff was closed using 0-Vicryl on a CT-1 needle in a running fashion.  The abdomen and pelvis were copiously irrigated.  The needle was removed without difficulty.  The instruments were then removed under direct visualization and the robotic ports removed.  The robot was undocked.  Deep, subcutaneous, figure-of-eight 0-Vicryl sutures on a UR-6 needle were placed in the 10-12 mm supraumbilical and infracostal incisions.  All skin incisions were closed in a subcuticular fashion using 3-0 Monocryl.  Derma bond was applied.  The vagina was swabbed with minimal bleeding noted.   All instrument and needle counts were correct x  2.
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
## 2013-05-11 NOTE — Preoperative (Signed)
Beta Blockers   Reason not to administer Beta Blockers:Not Applicable 

## 2013-05-11 NOTE — Anesthesia Preprocedure Evaluation (Addendum)
Anesthesia Evaluation  Patient identified by MRN, date of birth, ID band Patient awake    Reviewed: Allergy & Precautions, H&P , NPO status , Patient's Chart, lab work & pertinent test results  Airway Mallampati: II TM Distance: >3 FB Neck ROM: full    Dental  (+) Caps, Dental Advisory Given, Missing and Chipped Right upper lateral front tooth broken off completely.  Right upper front chipped.  Left upper front and lateral next to it are capped.  The other right lateral upper front is missing.:   Pulmonary neg pulmonary ROS, former smoker,  breath sounds clear to auscultation  Pulmonary exam normal       Cardiovascular Exercise Tolerance: Good negative cardio ROS  Rhythm:regular Rate:Normal     Neuro/Psych negative neurological ROS  negative psych ROS   GI/Hepatic negative GI ROS, Neg liver ROS,   Endo/Other  negative endocrine ROS  Renal/GU negative Renal ROS  negative genitourinary   Musculoskeletal   Abdominal   Peds  Hematology negative hematology ROS (+) anemia , hgb 10.7   Anesthesia Other Findings   Reproductive/Obstetrics negative OB ROS                          Anesthesia Physical Anesthesia Plan  ASA: II  Anesthesia Plan: General   Post-op Pain Management:    Induction: Intravenous  Airway Management Planned: Oral ETT  Additional Equipment:   Intra-op Plan:   Post-operative Plan: Extubation in OR  Informed Consent: I have reviewed the patients History and Physical, chart, labs and discussed the procedure including the risks, benefits and alternatives for the proposed anesthesia with the patient or authorized representative who has indicated his/her understanding and acceptance.   Dental Advisory Given  Plan Discussed with: CRNA and Surgeon  Anesthesia Plan Comments:         Anesthesia Quick Evaluation

## 2013-05-11 NOTE — Progress Notes (Signed)
PACU note------pt had bradycardia back in OR, block and prolonged PR; EKG done in PACU per Dr. Leta Jungling; shown to Dr. Leta Jungling and then Dr. Tamela Oddi notified of result and will call cardiologist to see pt while in hosp,  Pt is to go to telemetry monitoring floor post op

## 2013-05-11 NOTE — Anesthesia Postprocedure Evaluation (Signed)
  Anesthesia Post-op Note  Patient: Jessica Huber  Procedure(s) Performed: Procedure(s) (LRB): ROBOTIC ASSISTED TOTAL HYSTERECTOMY WITH BILATERAL SALPINGECTOMY (Bilateral)  Patient Location: PACU  Anesthesia Type: General  Level of Consciousness: awake and alert   Airway and Oxygen Therapy: Patient Spontanous Breathing  Post-op Pain: mild  Post-op Assessment: Post-op Vital signs reviewed, Patient's Cardiovascular Status Stable, Respiratory Function Stable, Patent Airway and No signs of Nausea or vomiting  Last Vitals:  Filed Vitals:   05/11/13 1515  BP: 130/76  Pulse: 82  Temp: 36.6 C  Resp: 14    Post-op Vital Signs: stable   Complications: No apparent anesthesia complications

## 2013-05-11 NOTE — Interval H&P Note (Signed)
History and Physical Interval Note:  05/11/2013 10:05 AM  Jessica Huber  has presented today for surgery, with the diagnosis of UTERINE FIBROIDS   The various methods of treatment have been discussed with the patient and family. After consideration of risks, benefits and other options for treatment, the patient has consented to  Procedure(s): ROBOTIC ASSISTED TOTAL HYSTERECTOMY WITH BILATERAL SALPINGECTOMY (Bilateral) as a surgical intervention .  The patient's history has been reviewed, patient examined, no change in status, stable for surgery.  I have reviewed the patient's chart and labs.  Questions were answered to the patient's satisfaction.     JACKSON-MOORE,Noam Franzen A

## 2013-05-12 ENCOUNTER — Observation Stay (HOSPITAL_COMMUNITY): Payer: BC Managed Care – PPO

## 2013-05-12 DIAGNOSIS — I359 Nonrheumatic aortic valve disorder, unspecified: Secondary | ICD-10-CM

## 2013-05-12 LAB — COMPREHENSIVE METABOLIC PANEL
ALT: 17 U/L (ref 0–35)
AST: 16 U/L (ref 0–37)
Albumin: 3.3 g/dL — ABNORMAL LOW (ref 3.5–5.2)
CO2: 27 mEq/L (ref 19–32)
Calcium: 9.2 mg/dL (ref 8.4–10.5)
Chloride: 99 mEq/L (ref 96–112)
Creatinine, Ser: 0.83 mg/dL (ref 0.50–1.10)
Sodium: 134 mEq/L — ABNORMAL LOW (ref 135–145)
Total Bilirubin: 0.2 mg/dL — ABNORMAL LOW (ref 0.3–1.2)
Total Protein: 6.9 g/dL (ref 6.0–8.3)

## 2013-05-12 LAB — CBC
MCH: 27 pg (ref 26.0–34.0)
MCHC: 33.1 g/dL (ref 30.0–36.0)
MCV: 81.5 fL (ref 78.0–100.0)
Platelets: 266 10*3/uL (ref 150–400)
RBC: 3.52 MIL/uL — ABNORMAL LOW (ref 3.87–5.11)
RDW: 15.8 % — ABNORMAL HIGH (ref 11.5–15.5)

## 2013-05-12 LAB — TROPONIN I: Troponin I: <0.3 ng/mL (ref <0.30)

## 2013-05-12 LAB — TSH: TSH: 1.18 u[IU]/mL (ref 0.350–4.500)

## 2013-05-12 NOTE — Progress Notes (Signed)
  Echocardiogram 2D Echocardiogram has been performed.  [name] 05/12/2013, 12:02 PM

## 2013-05-12 NOTE — Progress Notes (Signed)
Went to check on patient and she had drank the cranberry juice and eaten the jello that was given to her earlier. She stated, "I feel a lot better. I am actually starting to feel hungry." Patient requested more jello and cranberry juice which was given to her. Patient offered foods from the full liquid diet menu and she refused, stating "I don't want to push it." Will continue to monitor patient and encourage.

## 2013-05-12 NOTE — Progress Notes (Signed)
Patient has not had any more episodes of nausea/vomiting. Patient now able to tolerate full liquids without nausea, advancing patient's diet to regular. Told patient to take it slow to avoid nausea/vomiting. Will continue to monitor patient.

## 2013-05-12 NOTE — Progress Notes (Signed)
Patient ID: Jessica Huber, female   DOB: Jul 30, 1960, 52 y.o.   MRN: [id number] 1 Day Post-Op Procedure(s) (LRB): ROBOTIC ASSISTED TOTAL HYSTERECTOMY WITH BILATERAL SALPINGECTOMY (Bilateral)  Subjective: Patient reports no complaints.    Objective: Vital signs in last 24 hours: Temp:  [97.3 F (36.3 C)-98.2 F (36.8 C)] 98.2 F (36.8 C) (12/06 0700) Pulse Rate:  [79-90] 80 (12/06 0700) Resp:  [11-20] 20 (12/06 0815) BP: (128-146)/(69-76) 132/69 mmHg (12/06 0700) SpO2:  [98 %-100 %] 99 % (12/06 0700) Weight:  [98.204 kg (216 lb 8 oz)] 98.204 kg (216 lb 8 oz) (12/05 1635) Last BM Date: 05/10/13  Intake/Output from previous day: 12/05 0701 - 12/06 0700 In: 2808.8 [P.O.:680; I.V.:2128.8] Out: 910 [Urine:810; Blood:100] Intake/Output this shift: Total I/O In: 240 [P.O.:240] Out: -   Physical Examination:  General: alert GI: soft, non-tender; bowel sounds normal; no masses,  no organomegaly and incision: clean, dry and intact Extremities: extremities normal, atraumatic, no cyanosis or edema   Labs:  WBC/Hgb/Hct/Plts:  9.2/9.5/28.7/266 (12/06 0559) BUN/Cr/glu/ALT/AST/amyl/lip:  16/0.83/--/17/16/--/-- (12/06 0559)  Assessment:  52 y.o. s/p Procedure(s): ROBOTIC ASSISTED TOTAL HYSTERECTOMY WITH BILATERAL SALPINGECTOMY: progressing well  Pain: Pain is well-controlled on  oral medications.  Heme: Anemia: stable  CV:  Heart block--Cardiology consult appreciated  GI:  Tolerating po: Yes    Prophylaxis: intermittent pneumatic compression boots.  Plan: Advance diet Encourage ambulation Discontinue IV fluids Continued w/u per Cardiology   LOS: 1 day     JACKSON-MOORE,Jesse Hirst A 05/12/2013, 11:11 AM

## 2013-05-12 NOTE — Progress Notes (Signed)
   Patient Name: Jessica Huber Date of Encounter: 05/12/2013     Active Problems:   Leiomyoma of uterus, unspecified   Leiomyoma of uterus   First degree atrioventricular block   Murmur    SUBJECTIVE  Denies chest pain or dyspnea. Denies dizziness.  CURRENT MEDS . influenza vac split quadrivalent PF  0.5 mL Intramuscular Tomorrow-1000  . ketorolac  30 mg Intravenous Q6H   Or  . ketorolac  30 mg Intramuscular Q6H  . pantoprazole  40 mg Oral QHS    OBJECTIVE  Filed Vitals:   05/11/13 1635 05/11/13 2130 05/12/13 0152 05/12/13 0700  BP:  131/72 137/72 132/69  Pulse:  90 88 80  Temp:  97.5 F (36.4 C) 97.3 F (36.3 C) 98.2 F (36.8 C)  TempSrc:  Oral Axillary Oral  Resp:  20 20 20   Height: 5\' 9"  (1.753 m)     Weight: 216 lb 8 oz (98.204 kg)     SpO2:  100% 100% 99%    Intake/Output Summary (Last 24 hours) at 05/12/13 0812 Last data filed at 05/12/13 0615  Gross per 24 hour  Intake 2808.75 ml  Output    910 ml  Net 1898.75 ml   Filed Weights   05/11/13 1635  Weight: 216 lb 8 oz (98.204 kg)    PHYSICAL EXAM  General: Pleasant, NAD. Neuro: Alert and oriented X 3. Moves all extremities spontaneously. Psych: Normal affect. HEENT:  Normal  Neck: Supple without bruits or JVD. Lungs:  Resp regular and unlabored, CTA. Heart: RRR no s3, s4, Grade 2/6 systolic murmur at base. Abdomen: Soft, non-tender, non-distended, BS + x 4.  Extremities: No clubbing, cyanosis or edema. DP/PT/Radials 2+ and equal bilaterally.  Accessory Clinical Findings  CBC  Recent Labs  05/12/13 0559  WBC 9.2  HGB 9.5*  HCT 28.7*  MCV 81.5  PLT 266   Basic Metabolic Panel  Recent Labs  05/12/13 0559  NA 134*  K 3.6  CL 99  CO2 27  GLUCOSE 118*  BUN 16  CREATININE 0.83  CALCIUM 9.2   Liver Function Tests  Recent Labs  05/12/13 0559  AST 16  ALT 17  ALKPHOS 54  BILITOT 0.2*  PROT 6.9  ALBUMIN 3.3*   No results found for this basename: LIPASE, AMYLASE,  in the  last 72 hours Cardiac Enzymes No results found for this basename: CKTOTAL, CKMB, CKMBINDEX, TROPONINI,  in the last 72 hours BNP No components found with this basename: POCBNP,  D-Dimer No results found for this basename: DDIMER,  in the last 72 hours Hemoglobin A1C No results found for this basename: HGBA1C,  in the last 72 hours Fasting Lipid Panel No results found for this basename: CHOL, HDL, LDLCALC, TRIG, CHOLHDL, LDLDIRECT,  in the last 72 hours Thyroid Function Tests No results found for this basename: TSH, T4TOTAL, FREET3, T3FREE, THYROIDAB,  in the last 72 hours  TELE  During the night patient had Mobitz II block briefly at 0238 while asleep.  ECG  Today not yet done.  Radiology/Studies  No results found. Will get chest xray.  ASSESSMENT AND PLAN 1. Intermittent bradycardia and intermittent Mobitz II block while asleep. 2. Systolic heart murmur. 3. Anemia secondary to prior uterine bleeding.  Plan. Get troponin. Not yet done. EKG, echo, Chest xray today. Ambulate on telemetry today. Possibly home in am if stable.  Signed, Cassell Clement  MD

## 2013-05-12 NOTE — Progress Notes (Signed)
Utilization Review completed.  

## 2013-05-12 NOTE — Progress Notes (Signed)
Received another call from CMT who said that when patient's HR had gone down to 31, she had converted to a 2nd degree type I heart block and then a 2nd degree type II heart block. Upon review of the strips, this process lasted just slightly under a minute before she went into sinus tach and then normal sinus rhythm. Patient has remained in sinus rhythm since this time. Cardiologist on call notified, no new orders received at this time. Strips from this event printed out and placed in shadow chart. Will continue to monitor patient.

## 2013-05-12 NOTE — Progress Notes (Signed)
Received alert from CMT that patient's HR had gone down to 31 non-sustained. Went in room to check on patient and NT was in room, informed me that patient had just vomited after "eating jello too quickly." Patient denied pain, SOB, dizziness, or other symptoms. Vital signs stable. Cardiologist on call notified, said decrease in HR was most likely vagal from vomiting. No new orders. Will continue to monitor patient.  Jessica Huber

## 2013-05-12 NOTE — Progress Notes (Addendum)
Per daytime RN, patient developed nausea yesterday after trying to consume some clear liquids other than water. Because of this, changing patient to regular diet was postponed. Throughout the night, patient has not wanted to eat or drink anything but a few sips of water because she has "no appetite." Offered patient some apple juice from tray and she became nauseated. 4 mg IV zofran given. Will continue to monitor patient.

## 2013-05-13 MED ORDER — OXYCODONE-ACETAMINOPHEN 5-325 MG PO TABS: 1.0000 | ORAL_TABLET | ORAL | Status: DC | PRN

## 2013-05-13 NOTE — Progress Notes (Signed)
Patient Name: Jessica Huber Date of Encounter: 05/13/2013     Active Problems:   Leiomyoma of uterus, unspecified   Leiomyoma of uterus   First degree atrioventricular block   Murmur    SUBJECTIVE  Feels well. Has walked in hall. No chest pain dyspnea or dizziness.  CURRENT MEDS . ketorolac  30 mg Intravenous Q6H   Or  . ketorolac  30 mg Intramuscular Q6H  . pantoprazole  40 mg Oral QHS    OBJECTIVE  Filed Vitals:   05/12/13 0815 05/12/13 1500 05/12/13 2020 05/13/13 0510  BP:  114/69 136/70 119/69  Pulse:  79 85 76  Temp:  97.8 F (36.6 C) 98.1 F (36.7 C) 98.1 F (36.7 C)  TempSrc:  Oral Oral Oral  Resp: 20 18 18 16   Height:      Weight:      SpO2:  98% 97% 99%    Intake/Output Summary (Last 24 hours) at 05/13/13 0807 Last data filed at 05/12/13 1900  Gross per 24 hour  Intake 1711.25 ml  Output      0 ml  Net 1711.25 ml   Filed Weights   05/11/13 1635  Weight: 216 lb 8 oz (98.204 kg)    PHYSICAL EXAM  General: Pleasant, NAD. Neuro: Alert and oriented X 3. Moves all extremities spontaneously. Psych: Normal affect. HEENT:  Normal  Neck: Supple without bruits or JVD. Lungs:  Resp regular and unlabored, CTA. Heart: RRR no s3, s4,.  Soft systolic murmur at base  Abdomen: Soft, non-tender, non-distended, BS + x 4.  Extremities: No clubbing, cyanosis or edema. DP/PT/Radials 2+ and equal bilaterally.  Accessory Clinical Findings  CBC  Recent Labs  05/12/13 0559  WBC 9.2  HGB 9.5*  HCT 28.7*  MCV 81.5  PLT 266   Basic Metabolic Panel  Recent Labs  05/12/13 0559  NA 134*  K 3.6  CL 99  CO2 27  GLUCOSE 118*  BUN 16  CREATININE 0.83  CALCIUM 9.2   Liver Function Tests  Recent Labs  05/12/13 0559  AST 16  ALT 17  ALKPHOS 54  BILITOT 0.2*  PROT 6.9  ALBUMIN 3.3*   No results found for this basename: LIPASE, AMYLASE,  in the last 72 hours Cardiac Enzymes  Recent Labs  05/12/13 0940  TROPONINI <0.30   BNP No  components found with this basename: POCBNP,  D-Dimer No results found for this basename: DDIMER,  in the last 72 hours Hemoglobin A1C No results found for this basename: HGBA1C,  in the last 72 hours Fasting Lipid Panel No results found for this basename: CHOL, HDL, LDLCALC, TRIG, CHOLHDL, LDLDIRECT,  in the last 72 hours Thyroid Function Tests  Recent Labs  05/12/13 0559  TSH 1.180    TELE  No further pauses. PR interval normal 0.196  ECG  13-May-2013 05:29:57 [facility] 3W ROUTINE RECORD Normal sinus rhythm Moderate voltage criteria for LVH, may be normal variant Nonspecific T wave abnormality Abnormal ECG  2D echo: Study Conclusions  - Left ventricle: The cavity size was normal. Wall thickness was normal. Systolic function was normal. The estimated ejection fraction was in the range of 60% to 65%. Wall motion was normal; there were no regional wall motion abnormalities. - Aortic valve: Mild regurgitation.  Radiology/Studies  Dg Chest 2 View  05/12/2013   CLINICAL DATA:  Bradycardia. Intermittent heart block. Heart murmur.  EXAM: CHEST  2 VIEW  COMPARISON:  None.  FINDINGS: The heart  size and pulmonary vascularity are normal. Slight linear atelectasis in the right midzone and at the left base. No effusions. No osseous abnormality.  IMPRESSION: Slight atelectasis.   Electronically Signed   By: Geanie Cooley M.D.   On: 05/12/2013 08:49    ASSESSMENT AND PLAN 1. Intermittent bradycardia and intermittent Mobitz II block while asleep, resolved.  2. Systolic heart murmur. Echo shows mild AI.  No treatment needed at this point. 3. Anemia secondary to prior uterine bleeding.  Plan: Okay for discharge from cardiology standpoint. Continue Rx for anemia per her primary.  Return to cardiology PRN Signed,  Cassell Clement  MD

## 2013-05-13 NOTE — Discharge Summary (Signed)
  Physician Discharge Summary  Patient ID: Jessica Huber MRN: [id number] DOB/AGE: 1960/07/04 52 y.o.  Admit date: 05/11/2013 Discharge date: 05/13/2013  Admission Diagnoses: Leiomyoma of uterus, unspecified  Discharge Diagnoses:  Principal Problem:   Leiomyoma of uterus, unspecified Active Problems:   Leiomyoma of uterus   First degree atrioventricular block   Murmur   Discharged Condition: good  Hospital Course: On 05/11/2013, the patient underwent the following: Procedure(s): ROBOTIC ASSISTED TOTAL HYSTERECTOMY WITH BILATERAL SALPINGECTOMY.   Intraoperatively an arrhythmia was noted with a prolonged P-R interval.  Subsequently she was diagnosed with a first degree atrioventricular block.  Cardiology was consulted.  The remainder of her postoperative course was uneventful.  She was discharged to home on postoperative day 2 tolerating a regular diet.  Consults: cardiology  Significant Diagnostic Studies: labs: troponin, electrolytes; telemetry, cardiac echo  Treatments: surgery: see above  Discharge Exam: Blood pressure 119/69, pulse 76, temperature 98.1 F (36.7 C), temperature source Oral, resp. rate 16, height 5\' 9"  (1.753 m), weight 98.204 kg (216 lb 8 oz), last menstrual period 04/14/2013, SpO2 99.00%. General appearance: alert GI: soft, non-tender; bowel sounds normal; no masses,  no organomegaly Extremities: extremities normal, atraumatic, no cyanosis or edema Incision/Wound: C/D/I  Disposition: [facility]      Discharge Orders   Future Orders Complete By Expires   Call MD for:  extreme fatigue  As directed    Call MD for:  persistant dizziness or light-headedness  As directed    Call MD for:  persistant nausea and vomiting  As directed    Call MD for:  redness, tenderness, or signs of infection (pain, swelling, redness, odor or green/yellow discharge around incision site)  As directed    Call MD for:  severe uncontrolled pain  As directed    Call MD for:  temperature  >100.4  As directed    Diet - low sodium heart healthy  As directed    Discharge wound care:  As directed    Comments:     Keep clean and dry   Driving Restrictions  As directed    Comments:     No driving while taking narcotics   Increase activity slowly  As directed    Lifting restrictions  As directed    Comments:     No lifting > 5 lbs for 6 weeks   May shower / Bathe  As directed    Comments:     No tub baths for 6 weeks   May walk up steps  As directed    Sexual Activity Restrictions  As directed    Comments:     No intercourse for  8 weeks       Medication List         oxyCODONE-acetaminophen 5-325 MG per tablet  Commonly known as:  PERCOCET/ROXICET  Take 1-2 tablets by mouth every 4 (four) hours as needed for severe pain (moderate to severe pain (when tolerating fluids)).       Follow-up Information   Follow up with Antionette Char A, MD. Schedule an appointment as soon as possible for a visit in 2 weeks.   Specialty:  Obstetrics and Gynecology   Contact information:   [address] [place] [address] [phone]       Signed: Roseanna Rainbow 05/13/2013, 10:58 AM

## 2013-05-13 NOTE — Progress Notes (Signed)
Utilization Review completed.  

## 2013-05-14 ENCOUNTER — Encounter (HOSPITAL_COMMUNITY): Payer: Self-pay | Admitting: Obstetrics & Gynecology

## 2013-05-15 MED FILL — Metronidazole in NaCl 0.79% IV Soln 500 MG/100ML: INTRAVENOUS | Qty: 100 | Status: AC

## 2013-05-16 ENCOUNTER — Encounter: Payer: Self-pay | Admitting: Obstetrics

## 2013-05-16 ENCOUNTER — Encounter: Payer: Self-pay | Admitting: *Deleted

## 2013-05-21 ENCOUNTER — Ambulatory Visit: Payer: Self-pay | Admitting: Obstetrics & Gynecology

## 2013-05-24 ENCOUNTER — Encounter: Payer: Self-pay | Admitting: Obstetrics & Gynecology

## 2013-05-24 ENCOUNTER — Ambulatory Visit (INDEPENDENT_AMBULATORY_CARE_PROVIDER_SITE_OTHER): Payer: BC Managed Care – PPO | Admitting: Obstetrics & Gynecology

## 2013-05-24 VITALS — BP 126/83 | HR 92 | Temp 98.3°F | Ht 69.0 in | Wt 212.0 lb

## 2013-05-24 DIAGNOSIS — N951 Menopausal and female climacteric states: Secondary | ICD-10-CM | POA: Insufficient documentation

## 2013-05-24 DIAGNOSIS — N760 Acute vaginitis: Secondary | ICD-10-CM

## 2013-05-24 DIAGNOSIS — Z09 Encounter for follow-up examination after completed treatment for conditions other than malignant neoplasm: Secondary | ICD-10-CM

## 2013-05-24 LAB — POCT WET PREP (WET MOUNT): KOH Wet Prep POC: NEGATIVE

## 2013-05-24 LAB — CBC
MCH: 27.1 pg (ref 26.0–34.0)
MCHC: 33.3 g/dL (ref 30.0–36.0)
MCV: 81.2 fL (ref 78.0–100.0)
Platelets: 523 10*3/uL — ABNORMAL HIGH (ref 150–400)
RBC: 3.73 MIL/uL — ABNORMAL LOW (ref 3.87–5.11)
RDW: 17 % — ABNORMAL HIGH (ref 11.5–15.5)
WBC: 8.2 10*3/uL (ref 4.0–10.5)

## 2013-05-24 MED ORDER — ESTRADIOL 0.1 MG/24HR TD PTWK: 0.1000 mg | MEDICATED_PATCH | TRANSDERMAL | Status: DC

## 2013-05-24 NOTE — Patient Instructions (Addendum)
Follow up in 6 weeks for review of medication and hot flashes Hormone Therapy At menopause, your body begins making less estrogen and progesterone hormones. This causes the body to stop having menstrual periods. This is because estrogen and progesterone hormones control your periods and menstrual cycle. A lack of estrogen may cause symptoms such as:  Hot flushes (or hot flashes).  Vaginal dryness.  Dry skin.  Loss of sex drive.  Risk of bone loss (osteoporosis). When this happens, you may choose to take hormone therapy to get back the estrogen lost during menopause. When the hormone estrogen is given alone, it is usually referred to as ET (Estrogen Therapy). When the hormone progestin is combined with estrogen, it is generally called HT (Hormone Therapy). This was formerly known as hormone replacement therapy (HRT). Your caregiver can help you make a decision on what will be best for you. The decision to use HT seems to change often as new studies are done. Many studies do not agree on the benefits of hormone replacement therapy. LIKELY BENEFITS OF HT INCLUDE PROTECTION FROM:  Hot Flushes (also called hot flashes) - A hot flush is a sudden feeling of heat that spreads over the face and body. The skin may redden like a blush. It is connected with sweats and sleep disturbance. Women going through menopause may have hot flushes a few times a month or several times per day depending on the woman.  Osteoporosis (bone loss)- Estrogen helps guard against bone loss. After menopause, a woman's bones slowly lose calcium and become weak and brittle. As a result, bones are more likely to break. The hip, wrist, and spine are affected most often. Hormone therapy can help slow bone loss after menopause. Weight bearing exercise and taking calcium with vitamin D also can help prevent bone loss. There are also medications that your caregiver can prescribe that can help prevent osteoporosis.  Vaginal Dryness - Loss  of estrogen causes changes in the vagina. Its lining may become thin and dry. These changes can cause pain and bleeding during sexual intercourse. Dryness can also lead to infections. This can cause burning and itching. (Vaginal estrogen treatment can help relieve pain, itching, and dryness.)  Urinary Tract Infections are more common after menopause because of lack of estrogen. Some women also develop urinary incontinence because of low estrogen levels in the vagina and bladder.  Possible other benefits of estrogen include a positive effect on mood and short-term memory in women. RISKS AND COMPLICATIONS  Using estrogen alone without progesterone causes the lining of the uterus to grow. This increases the risk of lining of the uterus (endometrial) cancer. Your caregiver should give another hormone called progestin if you have a uterus.  Women who take combined (estrogen and progestin) HT appear to have an increased risk of breast cancer. The risk appears to be small, but increases throughout the time that HT is taken.  Combined therapy also makes the breast tissue slightly denser which makes it harder to read mammograms (breast X-rays).  Combined, estrogen and progesterone therapy can be taken together every day, in which case there may be spotting of blood. HT therapy can be taken cyclically in which case you will have menstrual periods. Cyclically means HT is taken for a set amount of days, then not taken, then this process is repeated.  HT may increase the risk of stroke, heart attack, breast cancer and forming blood clots in your leg.  Transdermal estrogen (estrogen that is absorbed through the skin  with a patch or a cream) may have more positive results with:  Cholesterol.  Blood pressure.  Blood clots. Having the following conditions may indicate you should not have HT:  Endometrial cancer.  Liver disease.  Breast cancer.  Heart disease.  History of blood  clots.  Stroke. TREATMENT   If you choose to take HT and have a uterus, usually estrogen and progestin are prescribed.  Your caregiver will help you decide the best way to take the medications.  Possible ways to take estrogen include:  Pills.  Patches.  Gels.  Sprays.  Vaginal estrogen cream, rings and tablets.  It is best to take the lowest dose possible that will help your symptoms and take them for the shortest period of time that you can.  Hormone therapy can help relieve some of the problems (symptoms) that affect women at menopause. Before making a decision about HT, talk to your caregiver about what is best for you. Be well informed and comfortable with your decisions. HOME CARE INSTRUCTIONS   Follow your caregivers advice when taking the medications.  A Pap test is done to screen for cervical cancer.  The first Pap test should be done at age 68.  Between ages 29 and 24, Pap tests are repeated every 2 years.  Beginning at age 9, you are advised to have a Pap test every 3 years as long as your past 3 Pap tests have been normal.  Some women have medical problems that increase the chance of getting cervical cancer. Talk to your caregiver about these problems. It is especially important to talk to your caregiver if a new problem develops soon after your last Pap test. In these cases, your caregiver may recommend more frequent screening and Pap tests.  The above recommendations are the same for women who have or have not gotten the vaccine for HPV (Human Papillomavirus).  If you had a hysterectomy for a problem that was not a cancer or a condition that could lead to cancer, then you no longer need Pap tests. However, even if you no longer need a Pap test, a regular exam is a good idea to make sure no other problems are starting.   If you are between ages 88 and 15, and you have had normal Pap tests going back 10 years, you no longer need Pap tests. However, even if you  no longer need a Pap test, a regular exam is a good idea to make sure no other problems are starting.   If you have had past treatment for cervical cancer or a condition that could lead to cancer, you need Pap tests and screening for cancer for at least 20 years after your treatment.  If Pap tests have been discontinued, risk factors (such as a new sexual partner) need to be re-assessed to determine if screening should be resumed.  Some women may need screenings more often if they are at high risk for cervical cancer.  Get mammograms done as per the advice of your caregiver. SEEK IMMEDIATE MEDICAL CARE IF:  You develop abnormal vaginal bleeding.  You have pain or swelling in your legs, shortness of breath, or chest pain.  You develop dizziness or headaches.  You have lumps or changes in your breasts or armpits.  You have slurred speech.  You develop weakness or numbness of your arms or legs.  You have pain, burning, or bleeding when urinating.  You develop abdominal pain. Document Released: 02/20/2003 Document Revised: 08/16/2011 Document Reviewed:  06/10/2010 [employer] Patient Information 2014 [place], [place].  Menopause Menopause is the normal time of life when menstrual periods stop completely. Menopause is complete when you have missed 12 consecutive menstrual periods. It usually occurs between the ages of 48 years and 55 years. Very rarely does a woman develop menopause before the age of 40 years. At menopause, your ovaries stop producing the female hormones estrogen and progesterone. This can cause undesirable symptoms and also affect your health. Sometimes the symptoms may occur 4 5 years before the menopause begins. There is no relationship between menopause and:  Oral contraceptives.  Number of children you had.  Race.  The age your menstrual periods started (menarche). Heavy smokers and very thin women may develop menopause earlier in life. CAUSES  The ovaries  stop producing the female hormones estrogen and progesterone.  Other causes include:  Surgery to remove both ovaries.  The ovaries stop functioning for no known reason.  Tumors of the pituitary gland in the brain.  Medical disease that affects the ovaries and hormone production.  Radiation treatment to the abdomen or pelvis.  Chemotherapy that affects the ovaries. SYMPTOMS   Hot flashes.  Night sweats.  Decrease in sex drive.  Vaginal dryness and thinning of the vagina causing painful intercourse.  Dryness of the skin and developing wrinkles.  Headaches.  Tiredness.  Irritability.  Memory problems.  Weight gain.  Bladder infections.  Hair growth of the face and chest.  Infertility. More serious symptoms include:  Loss of bone (osteoporosis) causing breaks (fractures).  Depression.  Hardening and narrowing of the arteries (atherosclerosis) causing heart attacks and strokes. DIAGNOSIS   When the menstrual periods have stopped for 12 straight months.  Physical exam.  Hormone studies of the blood. TREATMENT  There are many treatment choices and nearly as many questions about them. The decisions to treat or not to treat menopausal changes is an individual choice made with your health care provider. Your health care provider can discuss the treatments with you. Together, you can decide which treatment will work best for you. Your treatment choices may include:   Hormone therapy (estrogen and progesterone).  Non-hormonal medicines.  Treating the individual symptoms with medicine (for example antidepressants for depression).  Herbal medicines that may help specific symptoms.  Counseling by a psychiatrist or psychologist.  Group therapy.  Lifestyle changes including:  Eating healthy.  Regular exercise.  Limiting caffeine and alcohol.  Stress management and meditation.  No treatment. HOME CARE INSTRUCTIONS   Take the medicine your health care  provider gives you as directed.  Get plenty of sleep and rest.  Exercise regularly.  Eat a diet that contains calcium (good for the bones) and soy products (acts like estrogen hormone).  Avoid alcoholic beverages.  Do not smoke.  If you have hot flashes, dress in layers.  Take supplements, calcium, and vitamin D to strengthen bones.  You can use over-the-counter lubricants or moisturizers for vaginal dryness.  Group therapy is sometimes very helpful.  Acupuncture may be helpful in some cases. SEEK MEDICAL CARE IF:   You are not sure you are in menopause.  You are having menopausal symptoms and need advice and treatment.  You are still having menstrual periods after age 26 years.  You have pain with intercourse.  Menopause is complete (no menstrual period for 12 months) and you develop vaginal bleeding.  You need a referral to a specialist (gynecologist, psychiatrist, or psychologist) for treatment. SEEK IMMEDIATE MEDICAL CARE IF:   You  have severe depression.  You have excessive vaginal bleeding.  You fell and think you have a broken bone.  You have pain when you urinate.  You develop leg or chest pain.  You have a fast pounding heart beat (palpitations).  You have severe headaches.  You develop vision problems.  You feel a lump in your breast.  You have abdominal pain or severe indigestion. Document Released: 08/14/2003 Document Revised: 01/24/2013 Document Reviewed: 12/21/2012 [facility] Patient Information 2014 [place], [place].

## 2013-05-24 NOTE — Progress Notes (Signed)
Subjective:     Jessica Huber is a 52 y.o. female here for a follow uup exam.  Current complaints: still having abd pain and cramping, minimal yellow discharge with odor.  Personal health questionnaire reviewed: yes.  C/O night sweats.   Gynecologic History Patient's last menstrual period was 04/21/2013. Contraception: status post hysterectomy Last Pap: 2014. Results were: normal stated by patient Last mammogram: about 8 years ago. Results were: normal  Obstetric History OB History  Gravida Para Term Preterm AB SAB TAB Ectopic Multiple Living  3 3 2 1      3     # Outcome Date GA Lbr Len/2nd Weight Sex Delivery Anes PTL Lv  3 PRE           2 TRM           1 TRM                The following portions of the patient's history were reviewed and updated as appropriate: allergies, current medications, past family history, past medical history, past social history, past surgical history and problem list.  Review of Systems Pertinent items are noted in HPI.    Objective:     Abd: Incisions well-healed; soft/NT/ND Pelvic: scant white discharge     Assessment:   Menopausal hot flushes postop  Plan:   Check CBC Climara patch Return in 6 weeks or prn

## 2013-05-28 ENCOUNTER — Encounter: Payer: Self-pay | Admitting: Obstetrics & Gynecology

## 2013-06-06 ENCOUNTER — Encounter (HOSPITAL_COMMUNITY): Admission: RE | Payer: Self-pay | Source: Ambulatory Visit

## 2013-06-06 ENCOUNTER — Ambulatory Visit (HOSPITAL_COMMUNITY)
Admission: RE | Admit: 2013-06-06 | Payer: BC Managed Care – PPO | Source: Ambulatory Visit | Admitting: Obstetrics & Gynecology

## 2013-06-06 SURGERY — ROBOTIC ASSISTED TOTAL HYSTERECTOMY
Anesthesia: General

## 2013-06-27 ENCOUNTER — Encounter: Payer: Self-pay | Admitting: Obstetrics & Gynecology

## 2013-07-05 ENCOUNTER — Encounter: Payer: Self-pay | Admitting: Obstetrics & Gynecology

## 2013-07-05 ENCOUNTER — Ambulatory Visit (INDEPENDENT_AMBULATORY_CARE_PROVIDER_SITE_OTHER): Payer: BC Managed Care – PPO | Admitting: Obstetrics & Gynecology

## 2013-07-05 VITALS — BP 146/90 | HR 93 | Temp 98.4°F | Wt 220.0 lb

## 2013-07-05 DIAGNOSIS — Z130 Encounter for screening for diseases of the blood and blood-forming organs and certain disorders involving the immune mechanism: Secondary | ICD-10-CM

## 2013-07-05 LAB — HEMOGLOBIN AND HEMATOCRIT, BLOOD
HCT: 31.9 % — ABNORMAL LOW (ref 36.0–46.0)
Hemoglobin: 10.4 g/dL — ABNORMAL LOW (ref 12.0–15.0)

## 2013-07-05 MED ORDER — ESTRADIOL 1 MG PO TABS: 1.0000 mg | ORAL_TABLET | Freq: Every day | ORAL | Status: DC

## 2013-07-05 NOTE — Progress Notes (Signed)
Subjective:     Jessica Huber is a 53 y.o. female here for a follow up post hysterectomy exam.  Current complaints: Pt states that she is healing well. No complaints today.  Pt states that she has not had intercourse for 8 weeks as suggested.  Pt is on Climara and is concerned about cost.  Pt states that is to much to pay for.  Pt would also like a dental referral. Pt states that she is having tooth pain.  Personal health questionnaire reviewed: yes.   Gynecologic History Patient's last menstrual period was 04/21/2013. Contraception: status post hysterectomy Last mammogram: unsure . Results were: normal    Pt needs referral  The following portions of the patient's history were reviewed and updated as appropriate: allergies, current medications, past family history, past medical history, past social history, past surgical history and problem list.  Review of Systems Pertinent items are noted in HPI.   Objective:    BP 156/94  Pulse 93  Temp(Src) 98.4 F (36.9 C)  Wt 220 lb (99.791 kg)  LMP 04/21/2013  General Appearance:    Alert, cooperative, no distress, appears stated age  Abdomen:     Soft, non-tender, bowel sounds active all four quadrants,    no masses, no organomegaly  Genitalia:    Normal female without lesion, discharge or tenderness     Vaginal cuff intact/NT  Assessment:   Normal vaginal exam follow hysterectomy   Plan:  Referral to priimary care provider/Dentist  Resume intercourse after February 8 hgb Follow up in 6 weeks

## 2013-07-05 NOTE — Progress Notes (Signed)
Vital taken at [phone]

## 2013-08-16 ENCOUNTER — Encounter: Payer: Self-pay | Admitting: Obstetrics & Gynecology

## 2013-08-16 ENCOUNTER — Ambulatory Visit (INDEPENDENT_AMBULATORY_CARE_PROVIDER_SITE_OTHER): Payer: PRIVATE HEALTH INSURANCE | Admitting: Obstetrics & Gynecology

## 2013-08-16 VITALS — BP 175/97 | HR 77 | Temp 98.6°F | Ht 69.0 in | Wt 224.0 lb

## 2013-08-16 DIAGNOSIS — Z09 Encounter for follow-up examination after completed treatment for conditions other than malignant neoplasm: Secondary | ICD-10-CM

## 2013-08-16 NOTE — Progress Notes (Signed)
Subjective:     Jessica Huber is a 53 y.o. female who presents to the clinic 4 months status post Robotic assisted Total Hysterectomy for Uterine Fibroids. Eating a regular diet without difficulty. Bowel movements are abnormal with Constipation . Pain is controlled without any medications. Patient states she is having cramping pain like her cycle is about to come on and would like to know what she can take. Patient states her knees hurts a lot. Patient states since taking the estrace her breast hurt a lot. Patient states sometimes her feet and ankles swell. Patient states even though she wears loose socks she will still get a ring around her ankle.   The following portions of the patient's history were reviewed and updated as appropriate: allergies, current medications, past family history, past medical history, past social history, past surgical history and problem list.  Review of Systems Pertinent items are noted in HPI.    Objective:    BP 175/97  Pulse 77  Temp(Src) 98.6 F (37 C)  Ht 5\' 9"  (1.753 m)  Wt 224 lb (101.606 kg)  BMI 33.06 kg/m2  LMP 04/21/2013 General:  alert  Abdomen: soft, bowel sounds active, non-tender  Incision:   healing well, no drainage, no erythema, no hernia, no seroma, no swelling, no dehiscence, incision well approximated    Pelvic: vaginal cuff well-healed Assessment:    Doing well postoperatively.     Plan:    1. Continue any current medications. 2. Follow up: 3 months.

## 2013-08-17 ENCOUNTER — Encounter: Payer: Self-pay | Admitting: Obstetrics & Gynecology

## 2013-08-25 DIAGNOSIS — F419 Anxiety disorder, unspecified: Secondary | ICD-10-CM | POA: Insufficient documentation

## 2013-09-13 ENCOUNTER — Other Ambulatory Visit: Payer: Self-pay

## 2013-09-13 DIAGNOSIS — Z1231 Encounter for screening mammogram for malignant neoplasm of breast: Secondary | ICD-10-CM

## 2013-09-28 ENCOUNTER — Encounter (INDEPENDENT_AMBULATORY_CARE_PROVIDER_SITE_OTHER): Payer: Self-pay

## 2013-09-28 ENCOUNTER — Ambulatory Visit
Admission: RE | Admit: 2013-09-28 | Discharge: 2013-09-28 | Disposition: A | Discharge status: Home / Self Care | Payer: PRIVATE HEALTH INSURANCE | Source: Ambulatory Visit

## 2013-09-28 DIAGNOSIS — Z1231 Encounter for screening mammogram for malignant neoplasm of breast: Secondary | ICD-10-CM

## 2014-02-21 ENCOUNTER — Ambulatory Visit: Payer: PRIVATE HEALTH INSURANCE | Admitting: Obstetrics & Gynecology

## 2014-04-06 IMAGING — CR DG CHEST 2V
2 series · 2 of 2 positions shown · non-contrast
Comparison: None.

CLINICAL DATA: Bradycardia. Intermittent heart block. Heart murmur.

EXAM:
CHEST  2 VIEW

[w chest pa]
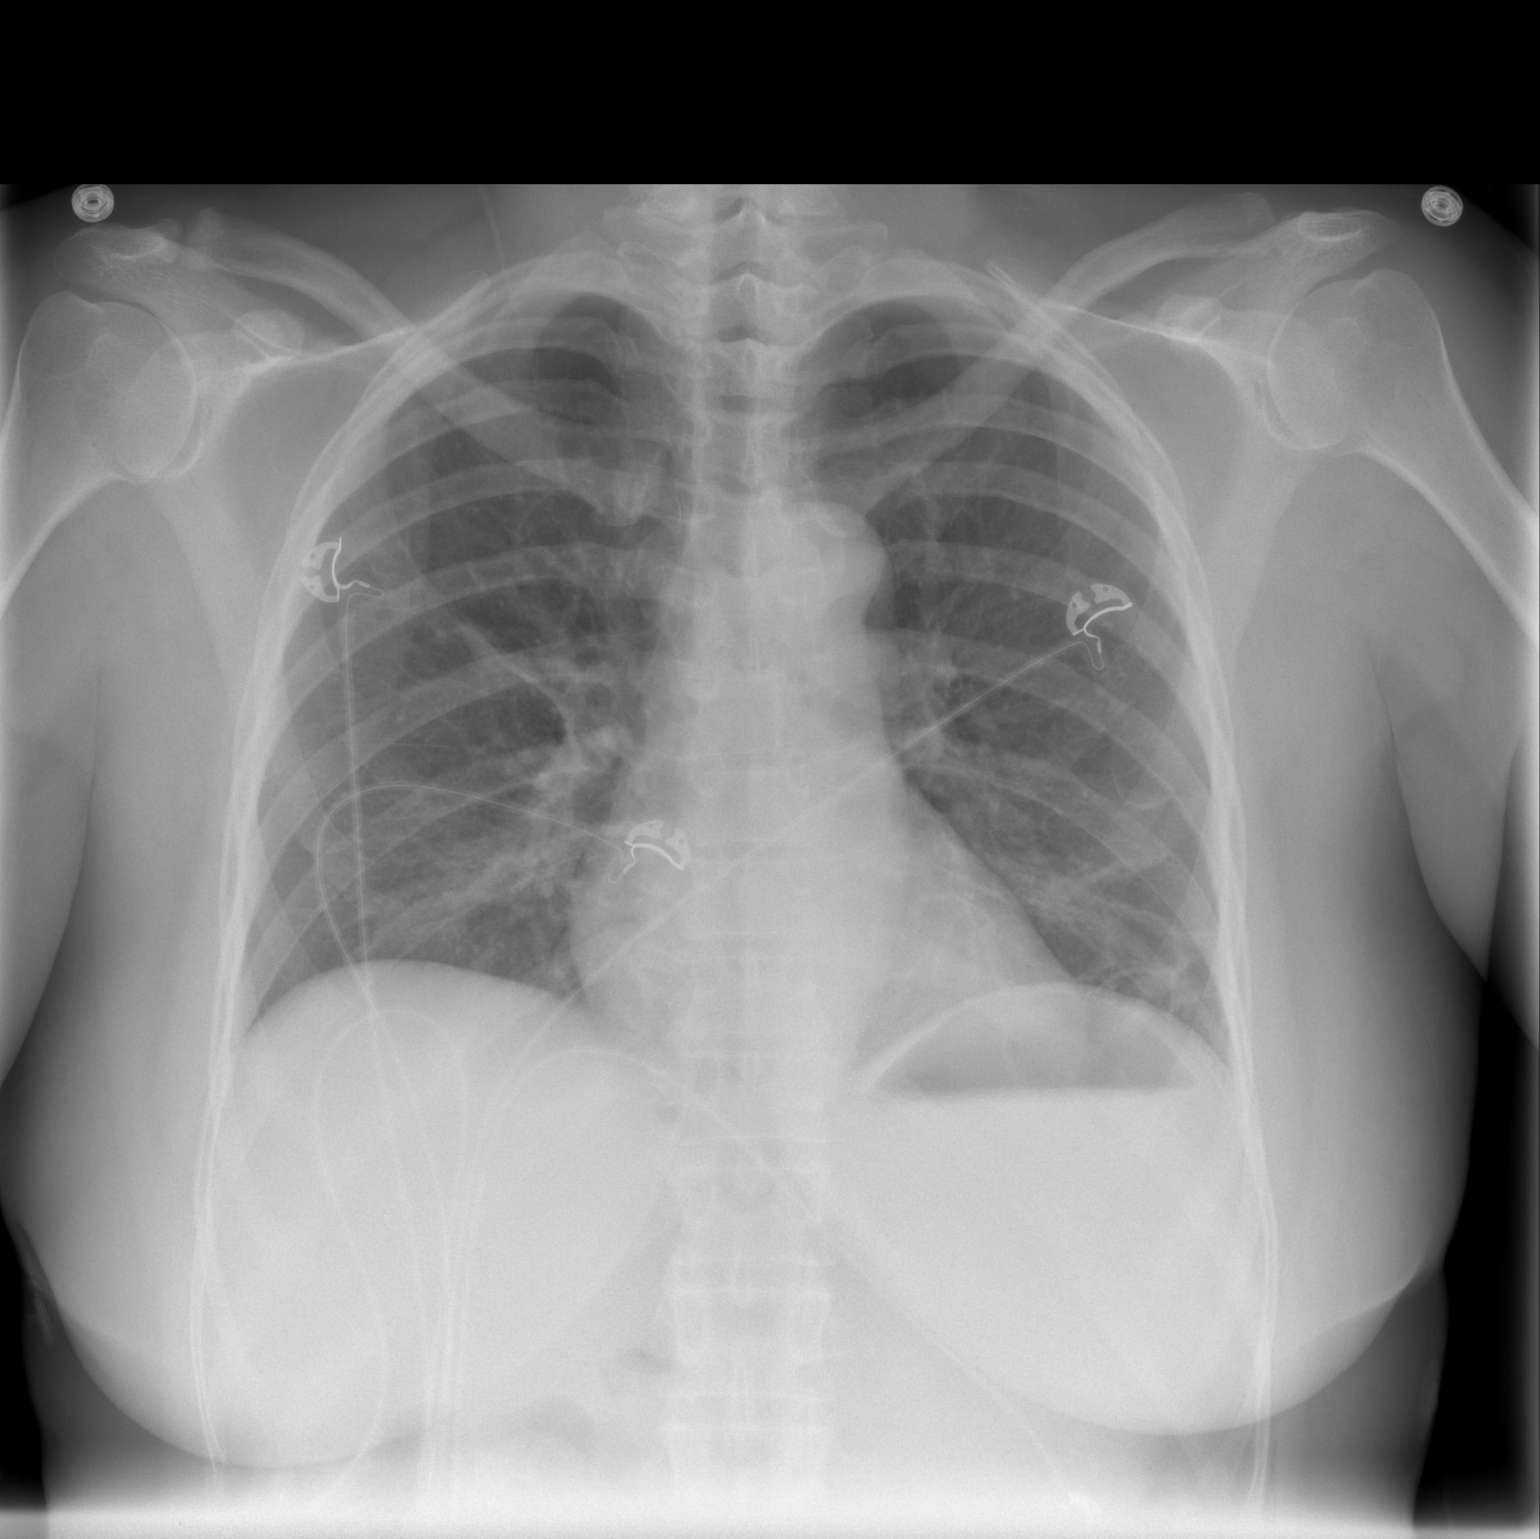

[w chest lat]
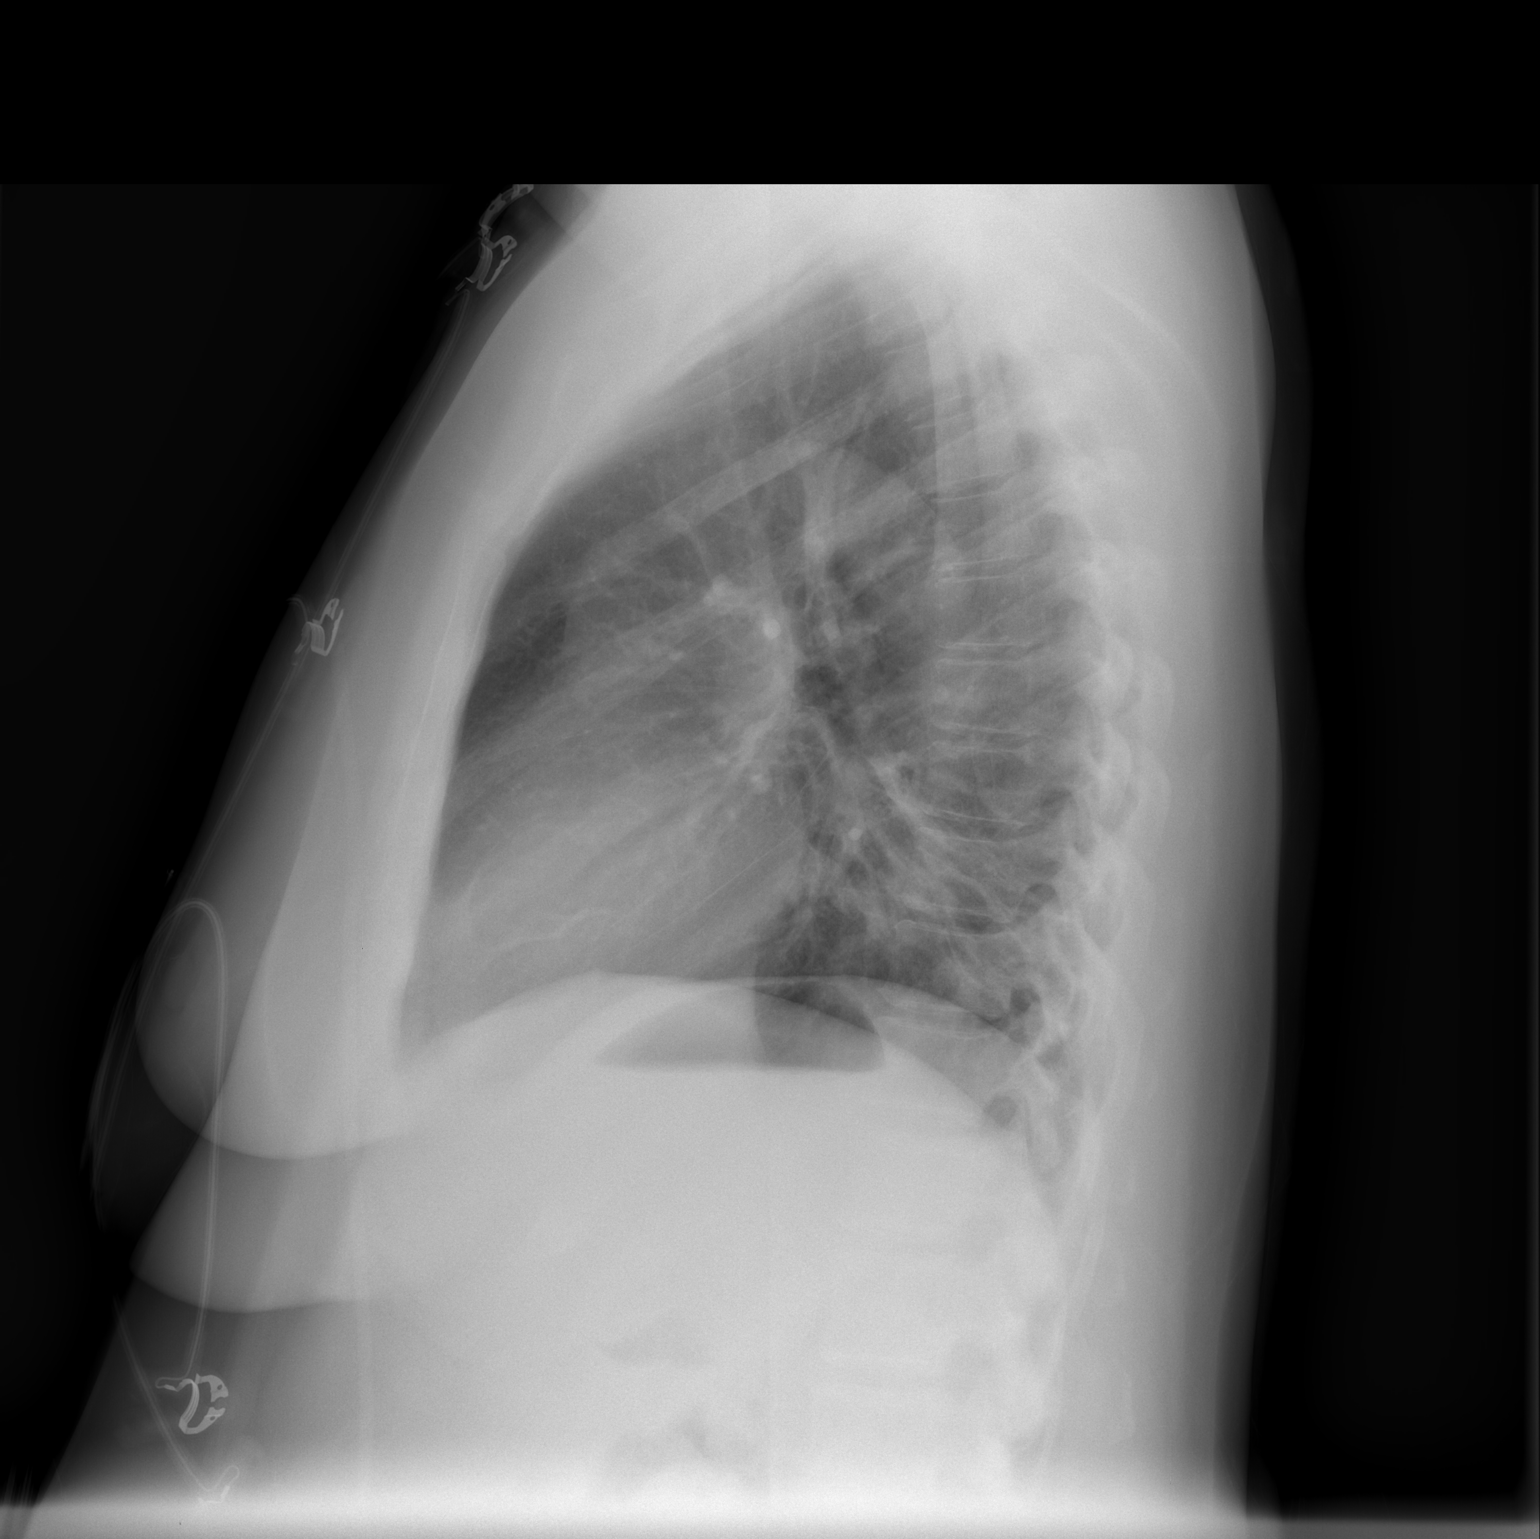

[2 of 2 positions shown; findings below may reference images not displayed]

FINDINGS: The heart size and pulmonary vascularity are normal. Slight linear
atelectasis in the right midzone and at the left base. No effusions.
No osseous abnormality.
IMPRESSION: Slight atelectasis.

## 2014-04-08 ENCOUNTER — Encounter: Payer: Self-pay | Admitting: Obstetrics & Gynecology

## 2014-05-16 ENCOUNTER — Ambulatory Visit (INDEPENDENT_AMBULATORY_CARE_PROVIDER_SITE_OTHER): Payer: PRIVATE HEALTH INSURANCE | Admitting: Obstetrics & Gynecology

## 2014-05-16 ENCOUNTER — Encounter: Payer: Self-pay | Admitting: Obstetrics & Gynecology

## 2014-05-16 VITALS — BP 123/77 | HR 88 | Temp 98.4°F | Wt 212.0 lb

## 2014-05-16 DIAGNOSIS — Z01419 Encounter for gynecological examination (general) (routine) without abnormal findings: Secondary | ICD-10-CM

## 2014-05-16 DIAGNOSIS — Z23 Encounter for immunization: Secondary | ICD-10-CM

## 2014-05-16 MED ORDER — ESTRADIOL 1 MG PO TABS: 1.0000 mg | ORAL_TABLET | Freq: Every day | ORAL | Status: AC

## 2014-05-17 NOTE — Patient Instructions (Signed)

## 2014-05-17 NOTE — Progress Notes (Signed)
Subjective:     Jessica Huber is a 53 y.o. female here for a routine exam.      Personal health questionnaire:  Is patient Jessica Huber, have a family history of breast and/or ovarian cancer: no Is there a family history of uterine cancer diagnosed at age < 20, gastrointestinal cancer, urinary tract cancer, family member who is a Field seismologist syndrome-associated carrier: no Is the patient overweight and hypertensive, family history of diabetes, personal history of gestational diabetes or PCOS: yes Is patient over 23, have PCOS,  family history of premature CHD under age 18, diabetes, smoke, have hypertension or peripheral artery disease:  yes At any time, has a partner hit, kicked or otherwise hurt or frightened you?: no Over the past 2 weeks, have you felt down, depressed or hopeless?: no Over the past 2 weeks, have you felt little interest or pleasure in doing things?:no   Gynecologic History Patient's last menstrual period was 04/21/2013. Contraception: status post hysterectomy Last mammogram: 4/15. Results were: normal  Obstetric History OB History  Gravida Para Term Preterm AB SAB TAB Ectopic Multiple Living  3 3 2 1      3     # Outcome Date GA Lbr Len/2nd Weight Sex Delivery Anes PTL Lv  3 Preterm           2 Term           1 Term               Past Medical History  Diagnosis Date  . Seasonal allergies   . Anxiety   . Depression   . History of blood transfusion   . Anemia     54'56,25'63 -transfusions due to anemia  . Heart murmur     many years ago    Past Surgical History  Procedure Laterality Date  . Esophagogastroduodenoscopy endoscopy    . Cesarean section      x2  . Tubal ligation    . Robotic assisted total hysterectomy with bilateral salpingo oopherectomy Bilateral 05/11/2013    Procedure: ROBOTIC ASSISTED TOTAL HYSTERECTOMY WITH BILATERAL SALPINGECTOMY;  Surgeon: Lahoma Crocker, MD;  Location: [facility];  Service: Gynecology;  Laterality: Bilateral;     Current outpatient prescriptions: lisinopril (PRINIVIL,ZESTRIL) 20 MG tablet, Take 20 mg by mouth daily., Disp: , Rfl: ;  vitamin B-12 (CYANOCOBALAMIN) 1000 MCG tablet, Take 1,000 mcg by mouth., Disp: , Rfl: ;  Vitamin D, Ergocalciferol, (DRISDOL) 50000 UNITS CAPS capsule, Take 50,000 Units by mouth., Disp: , Rfl: ;  estradiol (ESTRACE) 1 MG tablet, Take 1 tablet (1 mg total) by mouth daily., Disp: 90 tablet, Rfl: 2 No Known Allergies  History  Substance Use Topics  . Smoking status: Former Smoker    Quit date: 06/07/1992  . Smokeless tobacco: Never Used  . Alcohol Use: No    Family History  Problem Relation Age of Onset  . Colon cancer Paternal Grandfather   . Esophageal cancer Neg Hx   . Rectal cancer Neg Hx   . Stomach cancer Neg Hx       Review of Systems  Constitutional: negative for fatigue and weight loss Respiratory: negative for cough and wheezing Cardiovascular: negative for chest pain, fatigue and palpitations Gastrointestinal: negative for abdominal pain and change in bowel habits Musculoskeletal:negative for myalgias Neurological: negative for gait problems and tremors Behavioral/Psych: negative for abusive relationship, depression Endocrine: negative for temperature intolerance   Genitourinary:negative for abnormal menstrual periods, genital lesions, hot flashes, sexual problems and vaginal discharge  Integument/breast: negative for breast lump, breast tenderness, nipple discharge and skin lesion(s)    Objective:       BP 123/77 mmHg  Pulse 88  Temp(Src) 98.4 F (36.9 C)  Wt 96.163 kg (212 lb)  LMP 04/21/2013 General:   alert  Skin:   no rash or abnormalities  Lungs:   clear to auscultation bilaterally  Heart:   regular rate and rhythm, S1, S2 normal, no murmur, click, rub or gallop  Breasts:   normal without suspicious masses, skin or nipple changes or axillary nodes  Abdomen:  normal findings: no organomegaly, soft, non-tender and no hernia  Pelvis:   External genitalia: normal general appearance Urinary system: urethral meatus normal and bladder without fullness, nontender Vaginal: normal without tenderness, induration or masses Adnexa: normal bimanual exam    Lab Review  Labs reviewed no Radiologic studies reviewed no    Assessment:    Healthy female exam.    Plan:    Education reviewed: calcium supplements, low fat, low cholesterol diet and weight bearing exercise.   Meds ordered this encounter  Medications  . lisinopril (PRINIVIL,ZESTRIL) 20 MG tablet    Sig: Take 20 mg by mouth daily.  Marland Kitchen estradiol (ESTRACE) 1 MG tablet    Sig: Take 1 tablet (1 mg total) by mouth daily.    Dispense:  90 tablet    Refill:  2   Orders Placed This Encounter  Procedures  . Flu Vaccine QUAD 36+ mos IM (Fluarix)    Follow up as needed.

## 2014-06-03 ENCOUNTER — Encounter: Payer: Self-pay | Admitting: *Deleted

## 2014-06-04 ENCOUNTER — Encounter: Payer: Self-pay | Admitting: Obstetrics & Gynecology

## 2015-09-04 ENCOUNTER — Other Ambulatory Visit: Payer: Self-pay | Admitting: Nurse Practitioner

## 2015-09-04 DIAGNOSIS — Z1231 Encounter for screening mammogram for malignant neoplasm of breast: Secondary | ICD-10-CM

## 2015-10-27 ENCOUNTER — Ambulatory Visit: Payer: PRIVATE HEALTH INSURANCE

## 2016-01-19 ENCOUNTER — Ambulatory Visit
Admission: RE | Admit: 2016-01-19 | Discharge: 2016-01-19 | Disposition: A | Discharge status: Home / Self Care | Payer: Self-pay | Source: Ambulatory Visit | Attending: Nurse Practitioner | Admitting: Nurse Practitioner

## 2016-01-19 DIAGNOSIS — Z1231 Encounter for screening mammogram for malignant neoplasm of breast: Secondary | ICD-10-CM

## 2016-09-22 ENCOUNTER — Institutional Professional Consult (permissible substitution): Payer: 59 | Admitting: Internal Medicine

## 2017-03-02 ENCOUNTER — Other Ambulatory Visit: Payer: Self-pay | Admitting: Nurse Practitioner

## 2017-03-02 DIAGNOSIS — Z1231 Encounter for screening mammogram for malignant neoplasm of breast: Secondary | ICD-10-CM

## 2017-03-14 ENCOUNTER — Ambulatory Visit
Admission: RE | Admit: 2017-03-14 | Discharge: 2017-03-14 | Disposition: A | Discharge status: Home / Self Care | Payer: 59 | Source: Ambulatory Visit | Attending: Nurse Practitioner | Admitting: Nurse Practitioner

## 2017-03-14 DIAGNOSIS — Z1231 Encounter for screening mammogram for malignant neoplasm of breast: Secondary | ICD-10-CM

## 2017-10-06 ENCOUNTER — Encounter: Payer: Self-pay | Admitting: Physician Assistant

## 2017-10-13 ENCOUNTER — Encounter: Payer: Self-pay | Admitting: Internal Medicine

## 2017-12-22 ENCOUNTER — Ambulatory Visit (AMBULATORY_SURGERY_CENTER): Payer: Self-pay

## 2017-12-22 ENCOUNTER — Other Ambulatory Visit: Payer: Self-pay

## 2017-12-22 VITALS — Ht 69.0 in | Wt 202.6 lb

## 2017-12-22 DIAGNOSIS — Z86010 Personal history of colonic polyps: Secondary | ICD-10-CM

## 2017-12-22 MED ORDER — NA SULFATE-K SULFATE-MG SULF 17.5-3.13-1.6 GM/177ML PO SOLN: 1.0000 | Freq: Once | ORAL | 0 refills | Status: AC

## 2017-12-22 NOTE — Progress Notes (Signed)
No egg or soy allergy known to patient  No issues with past sedation with any surgeries  or procedures, no intubation problems  No diet pills per patient No home 02 use per patient  No blood thinners per patient  Pt denies issues with constipation  No A fib or A flutter  EMMI video sent to pt's e mail , pt declined    

## 2018-01-05 ENCOUNTER — Ambulatory Visit (AMBULATORY_SURGERY_CENTER): Payer: BLUE CROSS/BLUE SHIELD | Admitting: Internal Medicine

## 2018-01-05 ENCOUNTER — Encounter: Payer: Self-pay | Admitting: Internal Medicine

## 2018-01-05 VITALS — BP 101/64 | HR 82 | Temp 96.6°F | Resp 12 | Ht 69.0 in | Wt 202.0 lb

## 2018-01-05 DIAGNOSIS — Z86010 Personal history of colonic polyps: Secondary | ICD-10-CM

## 2018-01-05 MED ORDER — SODIUM CHLORIDE 0.9 % IV SOLN: 500.0000 mL | Freq: Once | INTRAVENOUS | Status: DC

## 2018-01-05 NOTE — Patient Instructions (Signed)
Normal Colonoscopy! Repeat in 5 years :)   YOU HAD AN ENDOSCOPIC PROCEDURE TODAY AT [facility]:   Refer to the procedure report that was given to you for any specific questions about what was found during the examination.  If the procedure report does not answer your questions, please call your gastroenterologist to clarify.  If you requested that your care partner not be given the details of your procedure findings, then the procedure report has been included in a sealed envelope for you to review at your convenience later.  YOU SHOULD EXPECT: Some feelings of bloating in the abdomen. Passage of more gas than usual.  Walking can help get rid of the air that was put into your GI tract during the procedure and reduce the bloating. If you had a lower endoscopy (such as a colonoscopy or flexible sigmoidoscopy) you may notice spotting of blood in your stool or on the toilet paper. If you underwent a bowel prep for your procedure, you may not have a normal bowel movement for a few days.  Please Note:  You might notice some irritation and congestion in your nose or some drainage.  This is from the oxygen used during your procedure.  There is no need for concern and it should clear up in a day or so.  SYMPTOMS TO REPORT IMMEDIATELY:   Following lower endoscopy (colonoscopy or flexible sigmoidoscopy):  Excessive amounts of blood in the stool  Significant tenderness or worsening of abdominal pains  Swelling of the abdomen that is new, acute  Fever of 100F or higher  For urgent or emergent issues, a gastroenterologist can be reached at any hour by calling [phone].   DIET:  We do recommend a small meal at first, but then you may proceed to your regular diet.  Drink plenty of fluids but you should avoid alcoholic beverages for 24 hours.  ACTIVITY:  You should plan to take it easy for the rest of today and you should NOT DRIVE or use heavy machinery until tomorrow (because of the  sedation medicines used during the test).    FOLLOW UP: Our staff will call the number listed on your records the next business day following your procedure to check on you and address any questions or concerns that you may have regarding the information given to you following your procedure. If we do not reach you, we will leave a message.  However, if you are feeling well and you are not experiencing any problems, there is no need to return our call.  We will assume that you have returned to your regular daily activities without incident.  If any biopsies were taken you will be contacted by phone or by letter within the next 1-3 weeks.  Please call us at [phone] if you have not heard about the biopsies in 3 weeks.    SIGNATURES/CONFIDENTIALITY: You and/or your care partner have signed paperwork which will be entered into your electronic medical record.  These signatures attest to the fact that that the information above on your After Visit Summary has been reviewed and is understood.  Full responsibility of the confidentiality of this discharge information lies with you and/or your care-partner.

## 2018-01-05 NOTE — Op Note (Signed)
[facility] Patient Name: Jessica Huber Procedure Date: 01/05/2018 9:01 AM MRN: [id number] Endoscopist: Jerene Bears , MD Age: 57 Referring MD:  Date of Birth: 06-25-1960 Gender: Female Account #: [id number] Procedure:                Colonoscopy Indications:              High risk colon cancer surveillance: Personal                            history of non-advanced adenoma, Last colonoscopy 5                            years ago, Family history of colon cancer in a                            first-degree relative Medicines:                Monitored Anesthesia Care Procedure:                Pre-Anesthesia Assessment:                           - Prior to the procedure, a History and Physical                            was performed, and patient medications and                            allergies were reviewed. The patient's tolerance of                            previous anesthesia was also reviewed. The risks                            and benefits of the procedure and the sedation                            options and risks were discussed with the patient.                            All questions were answered, and informed consent                            was obtained. Prior Anticoagulants: The patient has                            taken no previous anticoagulant or antiplatelet                            agents. ASA Grade Assessment: II - A patient with                            mild systemic disease. After reviewing the risks  and benefits, the patient was deemed in                            satisfactory condition to undergo the procedure.                           After obtaining informed consent, the colonoscope                            was passed under direct vision. Throughout the                            procedure, the patient's blood pressure, pulse, and                            oxygen saturations were monitored continuously.  The                            Colonoscope was introduced through the anus and                            advanced to the cecum, identified by appendiceal                            orifice and ileocecal valve. The colonoscopy was                            performed without difficulty. The patient tolerated                            the procedure well. The quality of the bowel                            preparation was excellent. The ileocecal valve,                            appendiceal orifice, and rectum were photographed. Scope In: 9:17:31 AM Scope Out: 9:26:57 AM Scope Withdrawal Time: 0 hours 7 minutes 36 seconds  Total Procedure Duration: 0 hours 9 minutes 26 seconds  Findings:                 The digital rectal exam was normal.                           The entire examined colon appeared normal on direct                            and retroflexion views. Complications:            No immediate complications. Estimated Blood Loss:     Estimated blood loss: none. Impression:               - The entire examined colon is normal on direct and  retroflexion views.                           - No specimens collected. Recommendation:           - Patient has a contact number available for                            emergencies. The signs and symptoms of potential                            delayed complications were discussed with the                            patient. Return to normal activities tomorrow.                            Written discharge instructions were provided to the                            patient.                           - Resume previous diet.                           - Continue present medications.                           - Repeat colonoscopy in 5 years for surveillance. Jerene Bears, MD 01/05/2018 9:29:19 AM This report has been signed electronically.

## 2018-01-05 NOTE — Progress Notes (Signed)
Report to PACU, RN, vss, BBS= Clear.  

## 2018-01-06 ENCOUNTER — Telehealth: Payer: Self-pay

## 2018-01-06 NOTE — Telephone Encounter (Signed)
First post procedure follow up call, no answer 

## 2018-01-06 NOTE — Telephone Encounter (Signed)
Second post procedure follow up call, no answer 

## 2018-03-30 ENCOUNTER — Other Ambulatory Visit: Payer: Self-pay | Admitting: Nephrology

## 2018-03-30 DIAGNOSIS — N183 Chronic kidney disease, stage 3 unspecified: Secondary | ICD-10-CM

## 2018-03-30 DIAGNOSIS — I129 Hypertensive chronic kidney disease with stage 1 through stage 4 chronic kidney disease, or unspecified chronic kidney disease: Secondary | ICD-10-CM

## 2018-04-18 ENCOUNTER — Ambulatory Visit
Admission: RE | Admit: 2018-04-18 | Discharge: 2018-04-18 | Disposition: A | Discharge status: Home / Self Care | Payer: BLUE CROSS/BLUE SHIELD | Source: Ambulatory Visit | Attending: Nephrology | Admitting: Nephrology

## 2018-04-18 DIAGNOSIS — I129 Hypertensive chronic kidney disease with stage 1 through stage 4 chronic kidney disease, or unspecified chronic kidney disease: Secondary | ICD-10-CM

## 2018-04-18 DIAGNOSIS — N183 Chronic kidney disease, stage 3 unspecified: Secondary | ICD-10-CM

## 2018-08-01 ENCOUNTER — Other Ambulatory Visit: Payer: Self-pay | Admitting: Nurse Practitioner

## 2018-08-01 ENCOUNTER — Ambulatory Visit
Admission: RE | Admit: 2018-08-01 | Discharge: 2018-08-01 | Disposition: A | Discharge status: Home / Self Care | Payer: BLUE CROSS/BLUE SHIELD | Source: Ambulatory Visit | Attending: Nurse Practitioner | Admitting: Nurse Practitioner

## 2018-08-01 DIAGNOSIS — Z1231 Encounter for screening mammogram for malignant neoplasm of breast: Secondary | ICD-10-CM

## 2019-08-14 ENCOUNTER — Other Ambulatory Visit: Payer: Self-pay | Admitting: Nurse Practitioner

## 2019-08-14 DIAGNOSIS — Z1231 Encounter for screening mammogram for malignant neoplasm of breast: Secondary | ICD-10-CM

## 2019-08-17 ENCOUNTER — Other Ambulatory Visit: Payer: Self-pay

## 2019-08-17 ENCOUNTER — Ambulatory Visit
Admission: RE | Admit: 2019-08-17 | Discharge: 2019-08-17 | Disposition: A | Discharge status: Home / Self Care | Payer: BC Managed Care – PPO | Source: Ambulatory Visit | Attending: Nurse Practitioner | Admitting: Nurse Practitioner

## 2019-08-17 DIAGNOSIS — Z1231 Encounter for screening mammogram for malignant neoplasm of breast: Secondary | ICD-10-CM

## 2019-09-06 ENCOUNTER — Ambulatory Visit: Payer: BLUE CROSS/BLUE SHIELD

## 2020-10-20 ENCOUNTER — Other Ambulatory Visit: Payer: Self-pay | Admitting: Nurse Practitioner

## 2020-10-20 DIAGNOSIS — Z1231 Encounter for screening mammogram for malignant neoplasm of breast: Secondary | ICD-10-CM

## 2020-11-27 ENCOUNTER — Ambulatory Visit
Admission: RE | Admit: 2020-11-27 | Discharge: 2020-11-27 | Disposition: A | Discharge status: Home / Self Care | Payer: BC Managed Care – PPO | Source: Ambulatory Visit | Attending: Nurse Practitioner | Admitting: Nurse Practitioner

## 2020-11-27 ENCOUNTER — Other Ambulatory Visit: Payer: Self-pay

## 2020-11-27 DIAGNOSIS — Z1231 Encounter for screening mammogram for malignant neoplasm of breast: Secondary | ICD-10-CM

## 2020-12-16 ENCOUNTER — Ambulatory Visit: Payer: BC Managed Care – PPO

## 2021-10-22 IMAGING — MG MM DIGITAL SCREENING BILAT W/ TOMO AND CAD
4 series · 4 of 16 positions shown · non-contrast
Comparison: Previous exam(s).

CLINICAL DATA: Screening.

EXAM:
DIGITAL SCREENING BILATERAL MAMMOGRAM WITH TOMOSYNTHESIS AND CAD
TECHNIQUE: Bilateral screening digital craniocaudal and mediolateral oblique
mammograms were obtained. Bilateral screening digital breast
tomosynthesis was performed. The images were evaluated with
computer-aided detection.

[R MLO synth-2D]
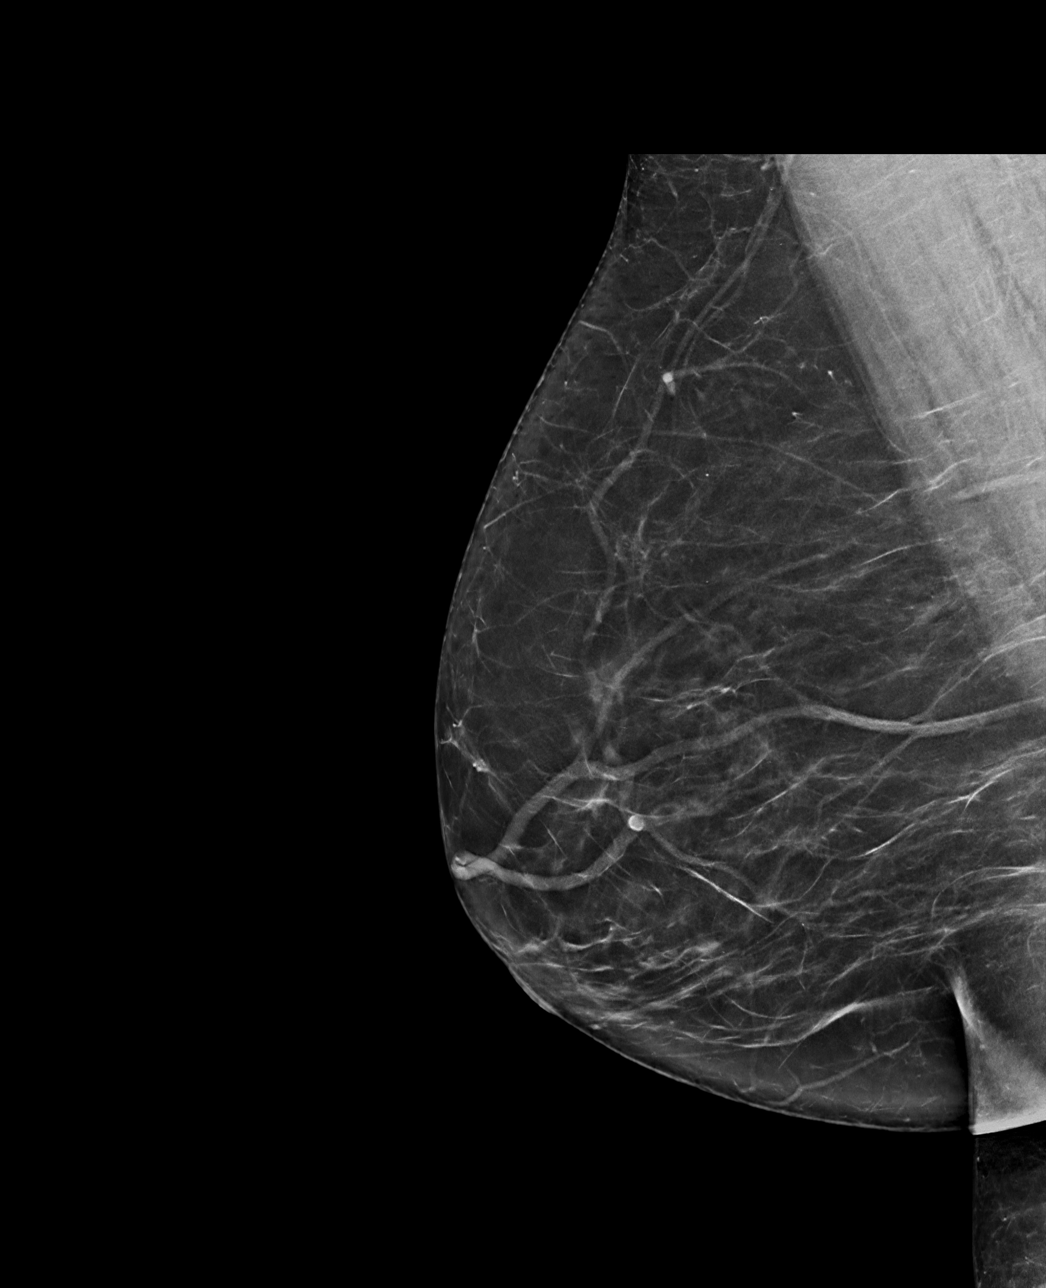

[L CC tomo · tomo slice 37/72.0]
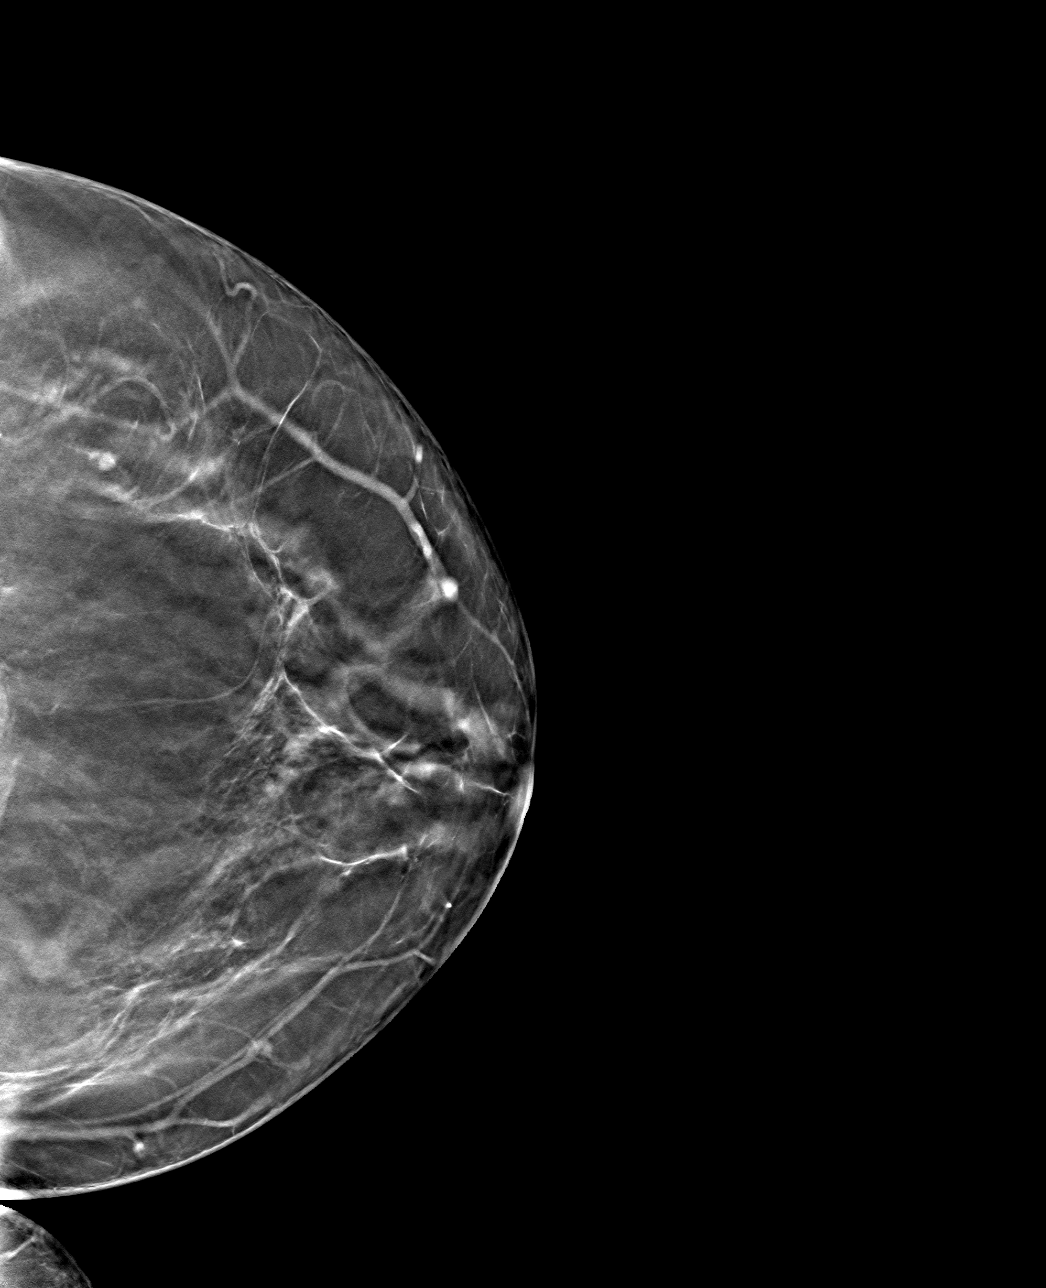

[R CC tomo · tomo slice 37/72.0]
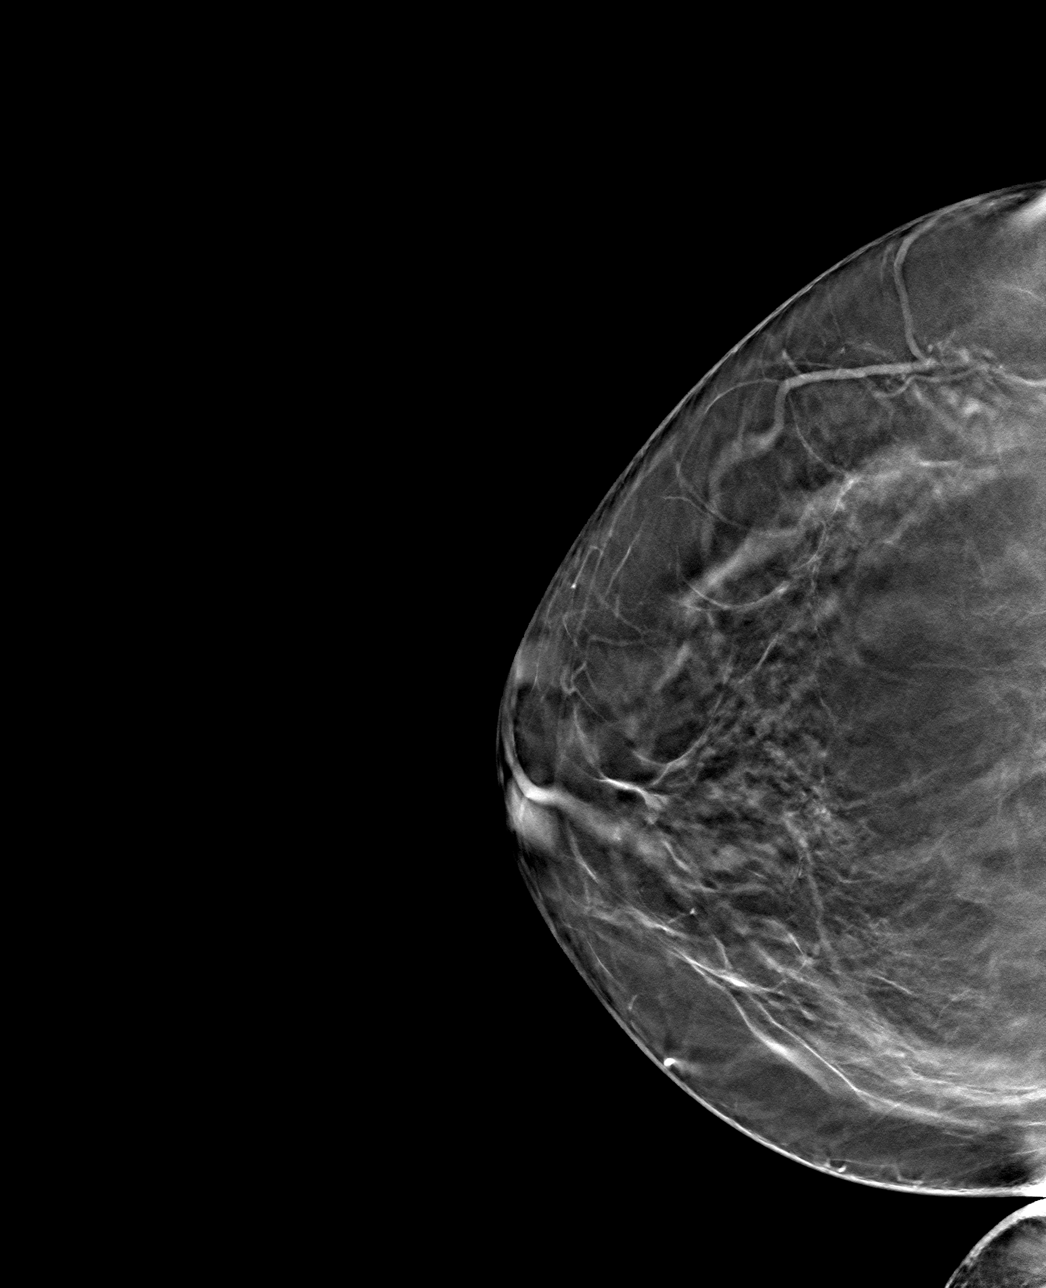

[L MLO tomo · tomo slice 43/86.0]
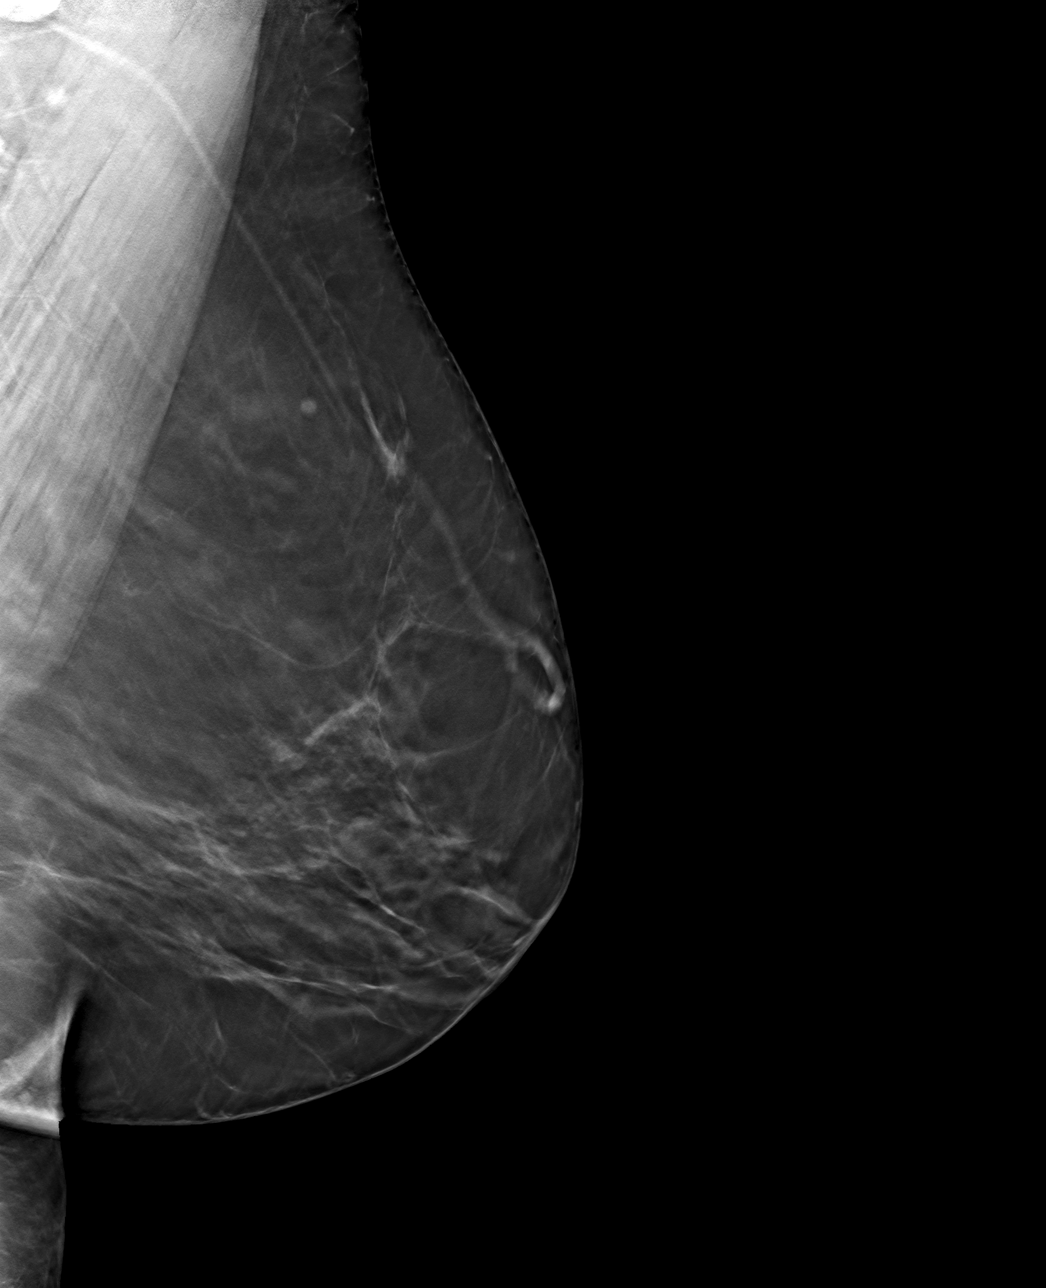

[4 of 16 positions shown; findings below may reference images not displayed]

ACR Breast Density Category b: There are scattered areas of
fibroglandular density.
FINDINGS: There are no findings suspicious for malignancy.
IMPRESSION: No mammographic evidence of malignancy. A result letter of this
screening mammogram will be mailed directly to the patient.

RECOMMENDATION:
Screening mammogram in one year. (Code:51-O-LD2)

BI-RADS CATEGORY  1: Negative.

## 2022-02-17 ENCOUNTER — Other Ambulatory Visit: Payer: Self-pay | Admitting: Nurse Practitioner

## 2022-02-17 DIAGNOSIS — Z1231 Encounter for screening mammogram for malignant neoplasm of breast: Secondary | ICD-10-CM

## 2022-02-18 ENCOUNTER — Ambulatory Visit
Admission: RE | Admit: 2022-02-18 | Discharge: 2022-02-18 | Disposition: A | Discharge status: Home / Self Care | Payer: BC Managed Care – PPO | Source: Ambulatory Visit | Attending: Nurse Practitioner | Admitting: Nurse Practitioner

## 2022-02-18 DIAGNOSIS — Z1231 Encounter for screening mammogram for malignant neoplasm of breast: Secondary | ICD-10-CM

## 2022-03-04 ENCOUNTER — Ambulatory Visit: Payer: BC Managed Care – PPO

## 2023-03-21 ENCOUNTER — Encounter: Payer: Self-pay | Admitting: Internal Medicine

## 2023-05-24 ENCOUNTER — Telehealth: Payer: Self-pay | Admitting: *Deleted

## 2023-05-24 ENCOUNTER — Ambulatory Visit (AMBULATORY_SURGERY_CENTER): Payer: BC Managed Care – PPO | Admitting: *Deleted

## 2023-05-24 VITALS — Ht 69.0 in | Wt 234.0 lb

## 2023-05-24 DIAGNOSIS — Z860100 Personal history of colon polyps, unspecified: Secondary | ICD-10-CM

## 2023-05-24 MED ORDER — NA SULFATE-K SULFATE-MG SULF 17.5-3.13-1.6 GM/177ML PO SOLN: 1.0000 | Freq: Once | ORAL | 0 refills | Status: AC

## 2023-05-24 NOTE — Progress Notes (Signed)
Pt's name and DOB verified at the beginning of the pre-visit wit 2 identifiers  Pt denies any difficulty with ambulating,sitting, laying down or rolling side to side  Pt has no issues with ambulation   Pt has no issues moving head neck or swallowing  No egg or soy allergy known to patient   No issues known to pt with past sedation with any surgeries or procedures  Pt denies having issues being intubated  No FH of Malignant Hyperthermia  Pt is not on diet pills or shots  Pt is not on home 02   Pt is not on blood thinners   Pt has frequent issues with constipation RN instructed pt to use Miralax per bottles instructions a week before prep days. Pt states they will  Pt is not on dialysis  Pt denise any abnormal heart rhythms   Pt denies any upcoming cardiac testing  Pt encouraged to use to use Singlecare or Goodrx to reduce cost   Patient's chart reviewed by Cathlyn Parsons CNRA prior to pre-visit and patient appropriate for the [facility].  Pre-visit completed and red dot placed by patient's name on their procedure day (on provider's schedule).  .  Visit by phone  Pt states weight is 234 lb  Instructed pt why it is important to and  to call if they have any changes in health or new medications. Directed them to the # given and on instructions.     Instructions reviewed. Pt given both [facility] main # and MD on call # prior to instructions.  Pt states understanding. Instructed to review again prior to procedure. Pt states they will.   Instructions sent by mail with coupon and by My Chart  Coupon sent via text to mobile phone and pt verified they received it

## 2023-05-24 NOTE — Telephone Encounter (Signed)
Attempt to reafch pt to do PV earl. LM with call back #

## 2023-06-13 ENCOUNTER — Encounter: Payer: Self-pay | Admitting: Certified Registered Nurse Anesthetist

## 2023-06-14 ENCOUNTER — Encounter: Payer: Self-pay | Admitting: Internal Medicine

## 2023-06-15 ENCOUNTER — Encounter: Payer: Self-pay | Admitting: Internal Medicine

## 2023-06-15 ENCOUNTER — Ambulatory Visit (AMBULATORY_SURGERY_CENTER): Payer: BC Managed Care – PPO | Admitting: Internal Medicine

## 2023-06-15 VITALS — BP 133/79 | HR 65 | Temp 97.2°F | Resp 10 | Ht 69.0 in | Wt 234.0 lb

## 2023-06-15 DIAGNOSIS — Z800 Family history of malignant neoplasm of digestive organs: Secondary | ICD-10-CM

## 2023-06-15 DIAGNOSIS — D124 Benign neoplasm of descending colon: Secondary | ICD-10-CM | POA: Diagnosis not present

## 2023-06-15 DIAGNOSIS — Z860100 Personal history of colon polyps, unspecified: Secondary | ICD-10-CM

## 2023-06-15 DIAGNOSIS — K648 Other hemorrhoids: Secondary | ICD-10-CM | POA: Diagnosis not present

## 2023-06-15 DIAGNOSIS — Z1211 Encounter for screening for malignant neoplasm of colon: Secondary | ICD-10-CM

## 2023-06-15 DIAGNOSIS — D175 Benign lipomatous neoplasm of intra-abdominal organs: Secondary | ICD-10-CM | POA: Diagnosis not present

## 2023-06-15 MED ORDER — SODIUM CHLORIDE 0.9 % IV SOLN: 500.0000 mL | Freq: Once | INTRAVENOUS | Status: DC

## 2023-06-15 NOTE — Patient Instructions (Signed)
 Educational handout provided to patient related to Hemorrhoids, Polyps  Resume previous diet  Continue present medications  Awaiting pathology results   YOU HAD AN ENDOSCOPIC PROCEDURE TODAY AT THE [facility]:   Refer to the procedure report that was given to you for any specific questions about what was found during the examination.  If the procedure report does not answer your questions, please call your gastroenterologist to clarify.  If you requested that your care partner not be given the details of your procedure findings, then the procedure report has been included in a sealed envelope for you to review at your convenience later.  YOU SHOULD EXPECT: Some feelings of bloating in the abdomen. Passage of more gas than usual.  Walking can help get rid of the air that was put into your GI tract during the procedure and reduce the bloating. If you had a lower endoscopy (such as a colonoscopy or flexible sigmoidoscopy) you may notice spotting of blood in your stool or on the toilet paper. If you underwent a bowel prep for your procedure, you may not have a normal bowel movement for a few days.  Please Note:  You might notice some irritation and congestion in your nose or some drainage.  This is from the oxygen used during your procedure.  There is no need for concern and it should clear up in a day or so.  SYMPTOMS TO REPORT IMMEDIATELY:  Following lower endoscopy (colonoscopy or flexible sigmoidoscopy):  Excessive amounts of blood in the stool  Significant tenderness or worsening of abdominal pains  Swelling of the abdomen that is new, acute  Fever of 100F or higher  For urgent or emergent issues, a gastroenterologist can be reached at any hour by calling [phone]. Do not use MyChart messaging for urgent concerns.    DIET:  We do recommend a small meal at first, but then you may proceed to your regular diet.  Drink plenty of fluids but you should avoid alcoholic  beverages for 24 hours.  ACTIVITY:  You should plan to take it easy for the rest of today and you should NOT DRIVE or use heavy machinery until tomorrow (because of the sedation medicines used during the test).    FOLLOW UP: Our staff will call the number listed on your records the next business day following your procedure.  We will call around 7:15- 8:00 am to check on you and address any questions or concerns that you may have regarding the information given to you following your procedure. If we do not reach you, we will leave a message.     If any biopsies were taken you will be contacted by phone or by letter within the next 1-3 weeks.  Please call us at [phone] if you have not heard about the biopsies in 3 weeks.    SIGNATURES/CONFIDENTIALITY: You and/or your care partner have signed paperwork which will be entered into your electronic medical record.  These signatures attest to the fact that that the information above on your After Visit Summary has been reviewed and is understood.  Full responsibility of the confidentiality of this discharge information lies with you and/or your care-partner.

## 2023-06-15 NOTE — Progress Notes (Signed)
 Called to room to assist during endoscopic procedure.  Patient ID and intended procedure confirmed with present staff. Received instructions for my participation in the procedure from the performing physician.

## 2023-06-15 NOTE — Progress Notes (Signed)
 GASTROENTEROLOGY PROCEDURE H&P NOTE   Primary Care Physician: Lenon Boyer, FNP    Reason for Procedure:  Personal history of nonadvanced adenomatous colon polyps   Plan:    Colonoscopy  Patient is appropriate for endoscopic procedure(s) in the ambulatory ([facility]) setting.  The nature of the procedure, as well as the risks, benefits, and alternatives were carefully and thoroughly reviewed with the patient. Ample time for discussion and questions allowed. The patient understood, was satisfied, and agreed to proceed.     HPI: Jessica Huber is a 63 y.o. female who presents for colonoscopy.  Medical history as below.  Tolerated the prep.  No recent chest pain or shortness of breath.  No abdominal pain today.  Past Medical History:  Diagnosis Date   Anemia    89'85,88'85 -transfusions due to anemia   Anxiety    Arthritis    kneew   Depression    Heart murmur    many years ago   History of blood transfusion    Hyperlipidemia    Seasonal allergies     Past Surgical History:  Procedure Laterality Date   CESAREAN SECTION     x2   COLONOSCOPY     ESOPHAGOGASTRODUODENOSCOPY ENDOSCOPY     POLYPECTOMY     ROBOTIC ASSISTED TOTAL HYSTERECTOMY WITH BILATERAL SALPINGO OOPHERECTOMY Bilateral 05/11/2013   Procedure: ROBOTIC ASSISTED TOTAL HYSTERECTOMY WITH BILATERAL SALPINGECTOMY;  Surgeon: Olam Mill, MD;  Location: [facility];  Service: Gynecology;  Laterality: Bilateral;   TUBAL LIGATION      Prior to Admission medications   Medication Sig Start Date End Date Taking? Authorizing Provider  atorvastatin (LIPITOR) 20 MG tablet  09/25/17  Yes [provider]  azelastine (ASTELIN) 0.1 % nasal spray one spray by Both Nostrils route 2 (two) times daily. Use in each nostril as directed 12/13/22 12/13/23 Yes [provider]  cetirizine (ZYRTEC) 10 MG tablet Take by mouth. 04/21/23  Yes [provider]  estradiol  (ESTRACE ) 1 MG tablet Take 1 tablet (1 mg  total) by mouth daily. 05/16/14  Yes Mill Olam, MD  fluticasone (FLONASE) 50 MCG/ACT nasal spray Place into the nose. 12/13/22 12/13/23 Yes [provider]  lansoprazole (PREVACID) 30 MG capsule 1 capsule before a meal Orally Once a day for 30 day(s)   Yes [provider]  lisinopril-hydrochlorothiazide (PRINZIDE,ZESTORETIC) 10-12.5 MG tablet Take 1 tablet by mouth daily.   Yes [provider]  tirzepatide (ZEPBOUND) 2.5 MG/0.5ML Pen Inject into the skin. 05/23/23   [provider]  traZODone (DESYREL) 50 MG tablet Take by mouth. 05/23/23   [provider]  vitamin D3 (CHOLECALCIFEROL) 25 MCG tablet Take 1,000 Units by mouth daily.    [provider]    Current Outpatient Medications  Medication Sig Dispense Refill   atorvastatin (LIPITOR) 20 MG tablet      azelastine (ASTELIN) 0.1 % nasal spray one spray by Both Nostrils route 2 (two) times daily. Use in each nostril as directed     cetirizine (ZYRTEC) 10 MG tablet Take by mouth.     estradiol  (ESTRACE ) 1 MG tablet Take 1 tablet (1 mg total) by mouth daily. 90 tablet 2   fluticasone (FLONASE) 50 MCG/ACT nasal spray Place into the nose.     lansoprazole (PREVACID) 30 MG capsule 1 capsule before a meal Orally Once a day for 30 day(s)     lisinopril-hydrochlorothiazide (PRINZIDE,ZESTORETIC) 10-12.5 MG tablet Take 1 tablet by mouth daily.     tirzepatide (ZEPBOUND) 2.5  MG/0.5ML Pen Inject into the skin.     traZODone (DESYREL) 50 MG tablet Take by mouth.     vitamin D3 (CHOLECALCIFEROL) 25 MCG tablet Take 1,000 Units by mouth daily.     Current Facility-Administered Medications  Medication Dose Route Frequency Provider Last Rate Last Admin   0.9 %  sodium chloride  infusion  500 mL Intravenous Once Thetis Schwimmer, Gordy HERO, MD        Allergies as of 06/15/2023   (No Known Allergies)    Family History  Problem Relation Age of Onset   Heart disease Father    Colon cancer Father     Esophageal cancer Neg Hx    Rectal cancer Neg Hx    Stomach cancer Neg Hx    Colon polyps Neg Hx     Social History   Socioeconomic History   Marital status: Single    Spouse name: Not on file   Number of children: Not on file   Years of education: Not on file   Highest education level: Not on file  Occupational History   Not on file  Tobacco Use   Smoking status: Former    Current packs/day: 0.00    Types: Cigarettes    Quit date: 06/07/1992    Years since quitting: 31.0   Smokeless tobacco: Never  Vaping Use   Vaping status: Never Used  Substance and Sexual Activity   Alcohol use: No   Drug use: No   Sexual activity: Yes    Birth control/protection: Surgical    Comment: Hysterectomy   Other Topics Concern   Not on file  Social History Narrative   Not on file   Social Drivers of Health   Financial Resource Strain: Low Risk  (07/05/2022)   Received from [facility], [facility]   Overall Financial Resource Strain (CARDIA)    Difficulty of Paying Living Expenses: Not hard at all  Food Insecurity: No Food Insecurity (07/05/2022)   Received from [facility], [facility]   Hunger Vital Sign    Worried About Running Out of Food in the Last Year: Never true    Ran Out of Food in the Last Year: Never true  Transportation Needs: No Transportation Needs (07/05/2022)   Received from [facility], [facility]   PRAPARE - Transportation    Lack of Transportation (Medical): No    Lack of Transportation (Non-Medical): No  Physical Activity: Insufficiently Active (08/13/2022)   Received from [facility], [facility]   Exercise Vital Sign    Days of Exercise per Week: 2 days    Minutes of Exercise per Session: 20 min  Stress: Stress Concern Present (08/13/2022)   Received from [facility], [facility] Occupational [facility] - Occupational Stress Questionnaire    Feeling of Stress : Very much  Social Connections: Socially Integrated (08/13/2022)    Received from [facility], [facility]   Social Network    How would you rate your social network (family, work, friends)?: Good participation with social networks  Intimate Partner Violence: Unknown (03/07/2023)   Received from [facility]   HITS    Physically Hurt: Not on file    Insult or Talk Down To: Not on file    Threaten Physical Harm: Not on file    Scream or Curse: Not on file    Physical Exam: Vital signs in last 24 hours: @BP  134/76   Pulse 71   Temp (!) 97.2 F (36.2 C)  Resp 14   Ht 5' 9 (1.753 m)   Wt 234 lb (106.1 kg)   LMP 04/14/2013   SpO2 100%   BMI 34.56 kg/m  GEN: NAD EYE: Sclerae anicteric ENT: MMM CV: Non-tachycardic Pulm: CTA b/l GI: Soft, NT/ND NEURO:  Alert & Oriented x 3   Gordy Starch, MD [facility]  06/15/2023 8:30 AM

## 2023-06-15 NOTE — Progress Notes (Signed)
 Pt's states no medical or surgical changes since previsit or office visit.

## 2023-06-15 NOTE — Progress Notes (Signed)
 Report given to PACU, vss

## 2023-06-15 NOTE — Op Note (Signed)
 [facility] Patient Name: Jessica Huber Procedure Date: 06/15/2023 8:27 AM MRN: [id number] Endoscopist: Gordy CHRISTELLA Starch , MD, [id number] Age: 63 Referring MD:  Date of Birth: May 09, 1961 Gender: Female Account #: [id number] Procedure:                Colonoscopy Indications:              High risk colon cancer surveillance: Personal                            history of non-advanced adenoma, Last colonoscopy:                            August 2019 Medicines:                Monitored Anesthesia Care Procedure:                Pre-Anesthesia Assessment:                           - Prior to the procedure, a History and Physical                            was performed, and patient medications and                            allergies were reviewed. The patient's tolerance of                            previous anesthesia was also reviewed. The risks                            and benefits of the procedure and the sedation                            options and risks were discussed with the patient.                            All questions were answered, and informed consent                            was obtained. Prior Anticoagulants: The patient has                            taken no anticoagulant or antiplatelet agents. ASA                            Grade Assessment: II - A patient with mild systemic                            disease. After reviewing the risks and benefits,                            the patient was deemed in satisfactory condition to  undergo the procedure.                           After obtaining informed consent, the colonoscope                            was passed under direct vision. Throughout the                            procedure, the patient's blood pressure, pulse, and                            oxygen saturations were monitored continuously. The                            Olympus Scope SN [id number] was introduced through the                             anus and advanced to the cecum, identified by                            appendiceal orifice and ileocecal valve. The                            colonoscopy was performed without difficulty. The                            patient tolerated the procedure well. The quality                            of the bowel preparation was good. The ileocecal                            valve, appendiceal orifice, and rectum were                            photographed. Scope In: 8:39:42 AM Scope Out: 8:50:18 AM Scope Withdrawal Time: 0 hours 9 minutes 20 seconds  Total Procedure Duration: 0 hours 10 minutes 36 seconds  Findings:                 The digital rectal exam was normal.                           There was a small lipoma, in the cecum.                           A 4 mm polyp was found in the descending colon. The                            polyp was sessile. The polyp was removed with a                            cold snare. Resection and retrieval were complete.  Internal hemorrhoids were found during                            retroflexion. The hemorrhoids were small.                           The exam was otherwise without abnormality. Complications:            No immediate complications. Estimated Blood Loss:     Estimated blood loss: none. Impression:               - Small lipoma in the cecum.                           - One 4 mm polyp in the descending colon, removed                            with a cold snare. Resected and retrieved.                           - Small internal hemorrhoids.                           - The examination was otherwise normal. Recommendation:           - Patient has a contact number available for                            emergencies. The signs and symptoms of potential                            delayed complications were discussed with the                            patient. Return to normal activities  tomorrow.                            Written discharge instructions were provided to the                            patient.                           - Resume previous diet.                           - Continue present medications.                           - Await pathology results.                           - Repeat colonoscopy is recommended for                            surveillance. The colonoscopy date will be  determined after pathology results from today's                            exam become available for review. Gordy CHRISTELLA Starch, MD 06/15/2023 8:54:20 AM This report has been signed electronically.

## 2023-06-16 ENCOUNTER — Telehealth: Payer: Self-pay | Admitting: *Deleted

## 2023-06-16 NOTE — Telephone Encounter (Signed)
 Attempted post procedure follow up call.  No answer - LVM.

## 2023-06-20 ENCOUNTER — Encounter: Payer: Self-pay | Admitting: Internal Medicine

## 2023-06-20 LAB — SURGICAL PATHOLOGY

## 2024-06-08 ENCOUNTER — Other Ambulatory Visit: Payer: Self-pay | Admitting: Nurse Practitioner

## 2024-06-08 DIAGNOSIS — Z1231 Encounter for screening mammogram for malignant neoplasm of breast: Secondary | ICD-10-CM

## 2024-06-21 ENCOUNTER — Ambulatory Visit

## 2024-06-22 ENCOUNTER — Ambulatory Visit
Admission: RE | Admit: 2024-06-22 | Discharge: 2024-06-22 | Disposition: A | Discharge status: Home / Self Care | Source: Ambulatory Visit | Attending: Nurse Practitioner | Admitting: Nurse Practitioner

## 2024-06-22 DIAGNOSIS — Z1231 Encounter for screening mammogram for malignant neoplasm of breast: Secondary | ICD-10-CM
